# Patient Record
Sex: Female | Born: 1975
Health system: Southern US, Community
[De-identification: ages and names within clinical notes are randomized; demographics above are authoritative.]

## PROBLEM LIST (undated history)

## (undated) DIAGNOSIS — T7840XA Allergy, unspecified, initial encounter: Secondary | ICD-10-CM

## (undated) DIAGNOSIS — D649 Anemia, unspecified: Secondary | ICD-10-CM

## (undated) DIAGNOSIS — R87629 Unspecified abnormal cytological findings in specimens from vagina: Secondary | ICD-10-CM

## (undated) DIAGNOSIS — M1990 Unspecified osteoarthritis, unspecified site: Secondary | ICD-10-CM

## (undated) DIAGNOSIS — D259 Leiomyoma of uterus, unspecified: Secondary | ICD-10-CM

## (undated) DIAGNOSIS — F419 Anxiety disorder, unspecified: Secondary | ICD-10-CM

## (undated) DIAGNOSIS — E282 Polycystic ovarian syndrome: Secondary | ICD-10-CM

## (undated) DIAGNOSIS — F329 Major depressive disorder, single episode, unspecified: Secondary | ICD-10-CM

## (undated) DIAGNOSIS — Z90710 Acquired absence of both cervix and uterus: Secondary | ICD-10-CM

## (undated) DIAGNOSIS — E079 Disorder of thyroid, unspecified: Secondary | ICD-10-CM

## (undated) DIAGNOSIS — F32A Depression, unspecified: Secondary | ICD-10-CM

## (undated) HISTORY — DX: Leiomyoma of uterus, unspecified: D25.9

## (undated) HISTORY — DX: Unspecified abnormal cytological findings in specimens from vagina: R87.629

## (undated) HISTORY — DX: Acquired absence of both cervix and uterus: Z90.710

## (undated) HISTORY — DX: Disorder of thyroid, unspecified: E07.9

## (undated) HISTORY — DX: Anxiety disorder, unspecified: F41.9

## (undated) HISTORY — PX: TUBAL LIGATION: SHX77

## (undated) HISTORY — DX: Major depressive disorder, single episode, unspecified: F32.9

## (undated) HISTORY — DX: Depression, unspecified: F32.A

## (undated) HISTORY — DX: Polycystic ovarian syndrome: E28.2

## (undated) HISTORY — DX: Anemia, unspecified: D64.9

## (undated) HISTORY — PX: NASAL SINUS SURGERY: SHX719

## (undated) HISTORY — PX: TOTAL VAGINAL HYSTERECTOMY: SHX2548

## (undated) HISTORY — DX: Allergy, unspecified, initial encounter: T78.40XA

## (undated) HISTORY — DX: Unspecified osteoarthritis, unspecified site: M19.90

---

## 1998-12-10 HISTORY — PX: NASAL SINUS SURGERY: SHX719

## 2000-04-22 ENCOUNTER — Other Ambulatory Visit: Admission: RE | Admit: 2000-04-22 | Discharge: 2000-04-22 | Payer: Self-pay | Admitting: *Deleted

## 2002-02-12 ENCOUNTER — Ambulatory Visit (HOSPITAL_COMMUNITY): Admission: RE | Admit: 2002-02-12 | Discharge: 2002-02-12 | Payer: Self-pay | Admitting: Internal Medicine

## 2002-02-13 ENCOUNTER — Encounter: Payer: Self-pay | Admitting: Internal Medicine

## 2002-02-20 ENCOUNTER — Encounter: Payer: Self-pay | Admitting: Internal Medicine

## 2002-02-20 ENCOUNTER — Encounter: Admission: RE | Admit: 2002-02-20 | Discharge: 2002-02-20 | Payer: Self-pay | Admitting: Internal Medicine

## 2004-08-30 ENCOUNTER — Encounter: Admission: RE | Admit: 2004-08-30 | Discharge: 2004-08-30 | Payer: Self-pay | Admitting: Internal Medicine

## 2004-10-11 ENCOUNTER — Ambulatory Visit: Payer: Self-pay | Admitting: Internal Medicine

## 2004-12-06 ENCOUNTER — Ambulatory Visit: Payer: Self-pay | Admitting: Internal Medicine

## 2006-10-30 ENCOUNTER — Ambulatory Visit (HOSPITAL_COMMUNITY): Admission: RE | Admit: 2006-10-30 | Discharge: 2006-10-30 | Payer: Self-pay | Admitting: Orthopaedic Surgery

## 2007-12-11 DIAGNOSIS — R87629 Unspecified abnormal cytological findings in specimens from vagina: Secondary | ICD-10-CM

## 2007-12-11 HISTORY — DX: Unspecified abnormal cytological findings in specimens from vagina: R87.629

## 2008-12-10 HISTORY — PX: LAPAROSCOPIC CHOLECYSTECTOMY: SUR755

## 2009-03-03 ENCOUNTER — Ambulatory Visit: Payer: Self-pay | Admitting: General Surgery

## 2009-03-08 ENCOUNTER — Ambulatory Visit: Payer: Self-pay | Admitting: General Surgery

## 2009-12-13 ENCOUNTER — Encounter (INDEPENDENT_AMBULATORY_CARE_PROVIDER_SITE_OTHER): Payer: Self-pay | Admitting: *Deleted

## 2010-07-24 ENCOUNTER — Telehealth: Payer: Self-pay | Admitting: Internal Medicine

## 2011-01-09 NOTE — Progress Notes (Signed)
Summary: Schedule Colonoscopy  Phone Note Outgoing Call Call back at [facility] Phone [phone]   Call placed by: Harlow Mares CMA Duncan Dull),  July 24, 2010 10:19 AM Call placed to: Patient Summary of Call: Left message on patients machine to call back. patient is due for a colonoscopy due to personal hx of colon polyps.  Initial call taken by: Harlow Mares CMA Duncan Dull),  July 24, 2010 10:20 AM  Follow-up for Phone Call        Left a message on the patient machine to call back and schedule a previsit and procedure with our office. A letter will be mailed to the patient.   Follow-up by: Harlow Mares CMA Duncan Dull),  July 31, 2010 9:10 AM

## 2011-01-09 NOTE — Letter (Signed)
Summary: Colonoscopy Letter  [facility]  [address] [place], [place] [address]   Phone: [phone]  Fax: [phone]      December 13, 2009 MRN: [id number]   Marissa Sandoval [address] [place], [place]  [address]   Dear Ms. Racine,   According to your medical record, it is time for you to schedule a Colonoscopy. The American Cancer Society recommends this procedure as a method to detect early colon cancer. Patients with a family history of colon cancer, or a personal history of colon polyps or inflammatory bowel disease are at increased risk.  This letter has beeen generated based on the recommendations made at the time of your procedure. If you feel that in your particular situation this may no longer apply, please contact our office.  Please call our office at [phone] to schedule this appointment or to update your records at your earliest convenience.  Thank you for cooperating with [place] to provide you with the very best care possible.   Sincerely,  Hedwig Morton. Juanda Chance, M.D.  [facility] [phone]

## 2011-12-03 ENCOUNTER — Ambulatory Visit: Payer: Self-pay

## 2012-03-03 ENCOUNTER — Encounter: Payer: Self-pay | Admitting: Internal Medicine

## 2012-03-18 ENCOUNTER — Encounter: Payer: Self-pay | Admitting: Internal Medicine

## 2012-04-29 ENCOUNTER — Encounter: Payer: Self-pay | Admitting: Internal Medicine

## 2012-05-14 IMAGING — US US PELV - US TRANSVAGINAL
1 series · 17 of 25 positions shown · non-contrast
Comparison: none

REASON FOR EXAM: heavy menstrual cycles PCOS RL abd pain
COMMENTS:

[Series 1: us pelv - us transvaginal · 17 of 106 slices shown]
[im 1/106]
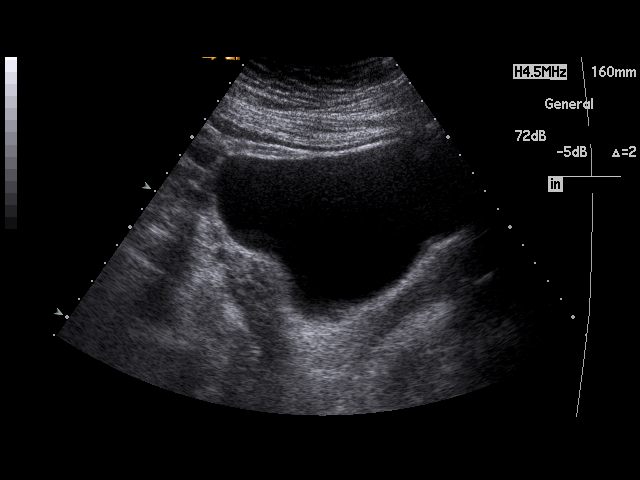
[im 9/106]
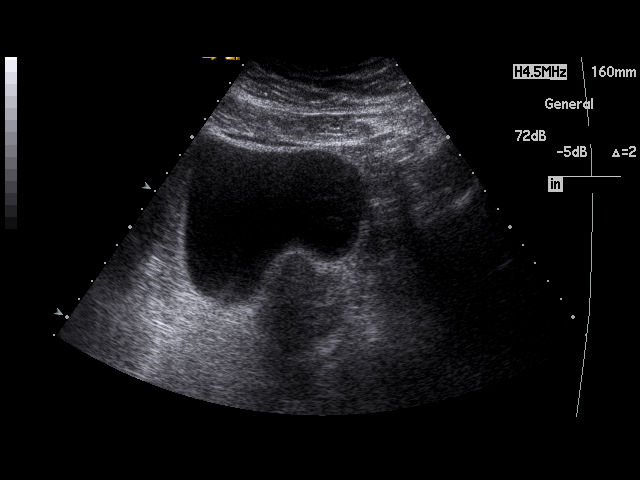
[im 14/106]
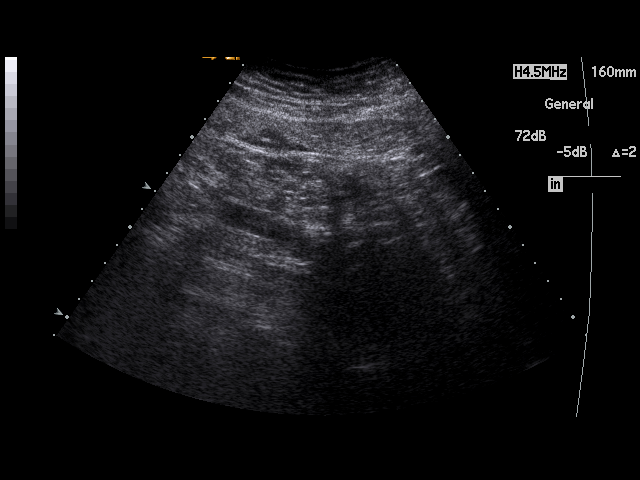
[im 22/106]
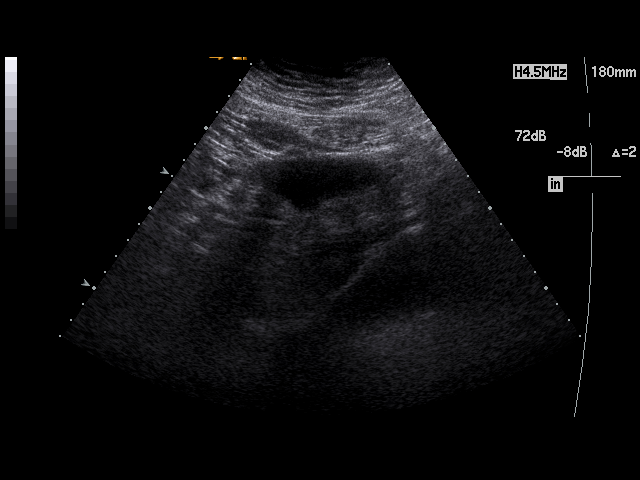
[im 27/106]
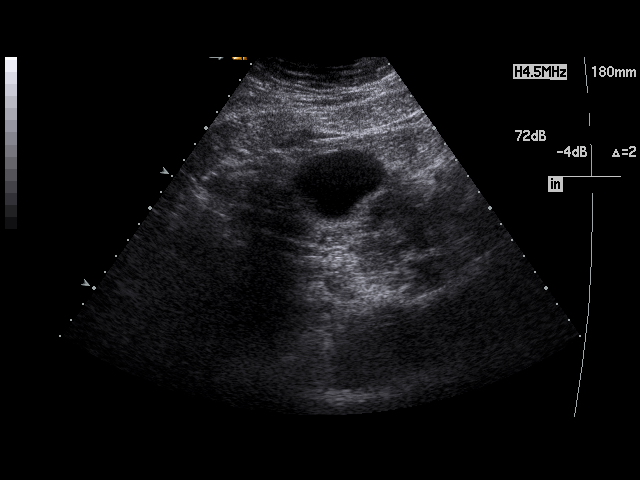
[im 36/106]
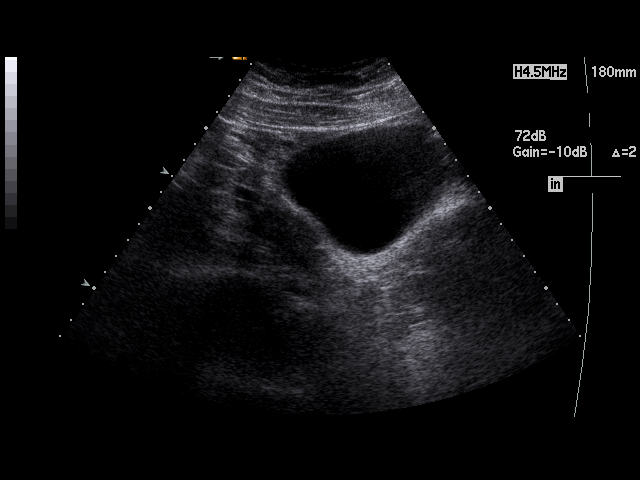
[im 40/106]
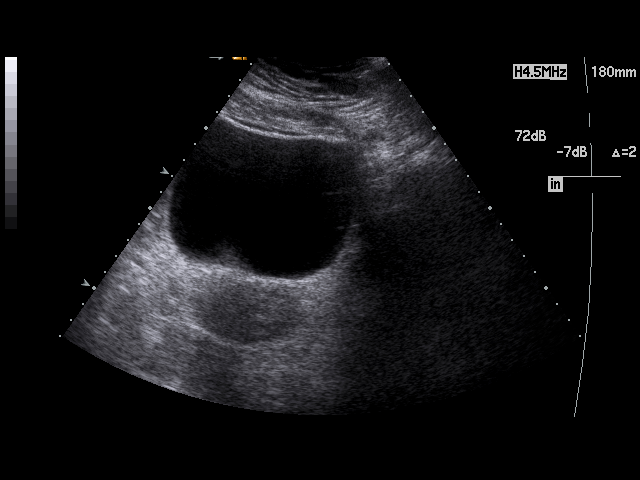
[im 49/106]
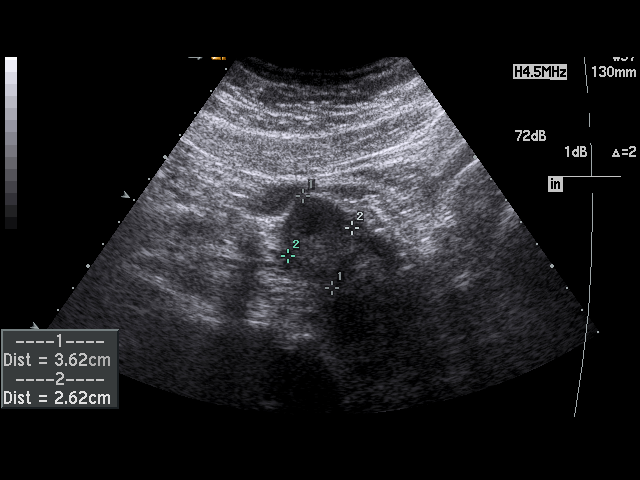
[im 53/106]
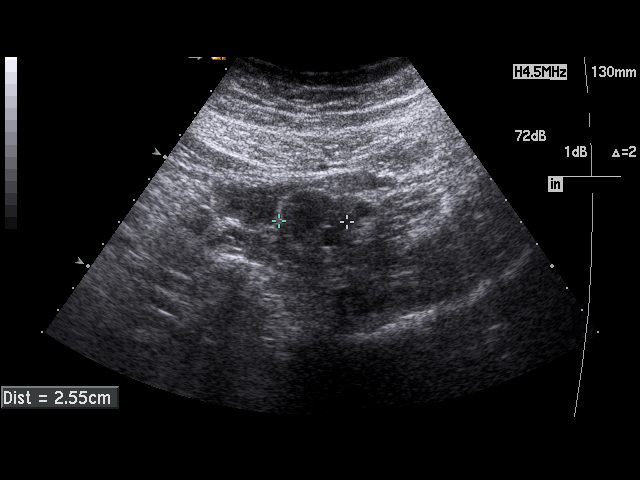
[im 57/106]
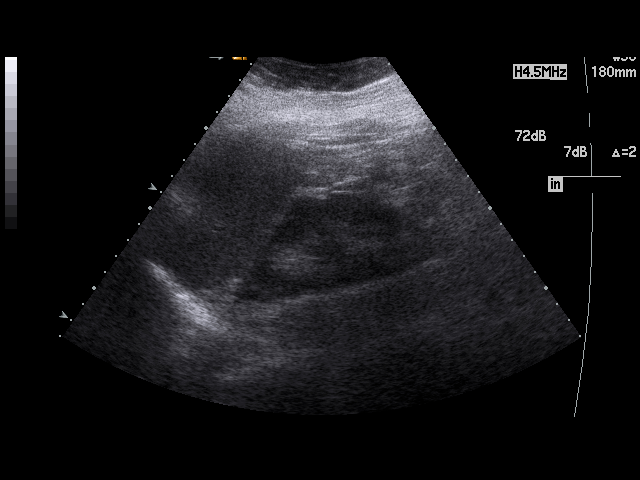
[im 66/106]
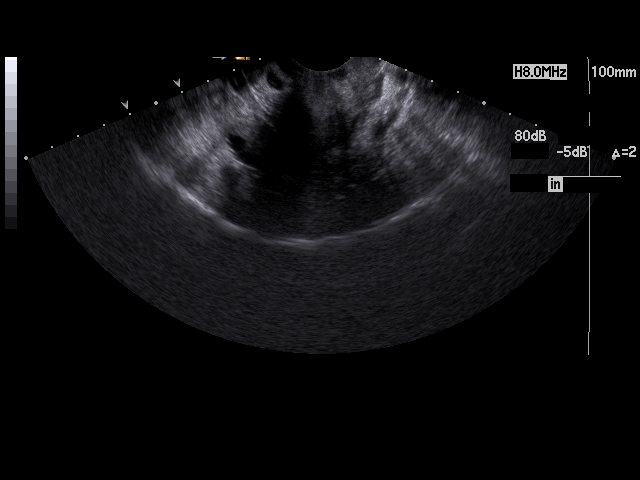
[im 71/106]
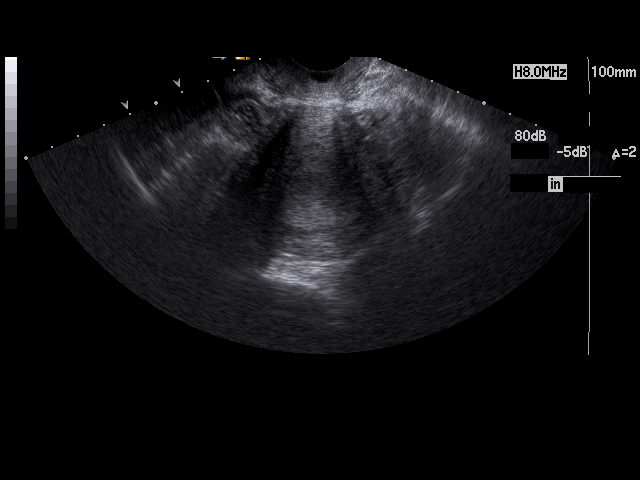
[im 79/106]
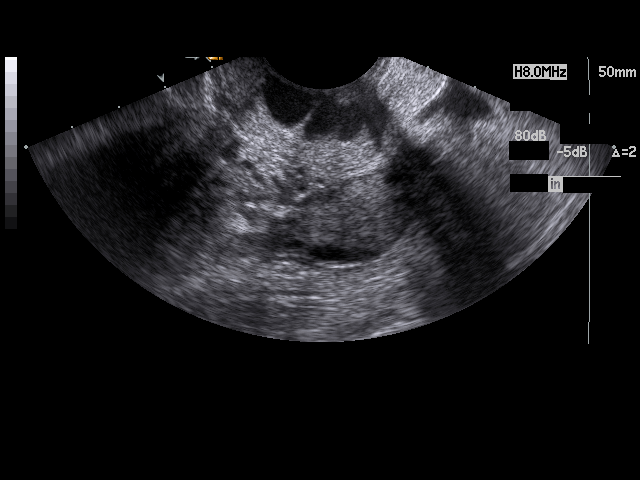
[im 84/106]
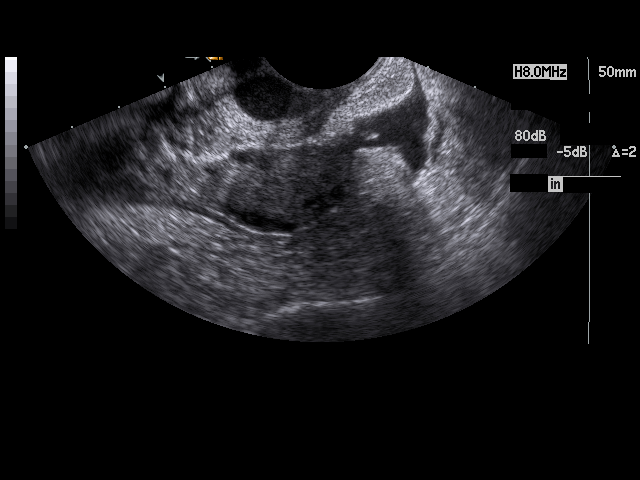
[im 92/106]
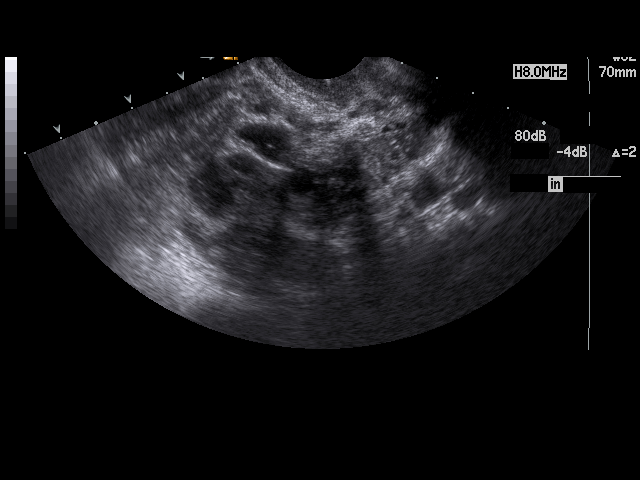
[im 97/106]
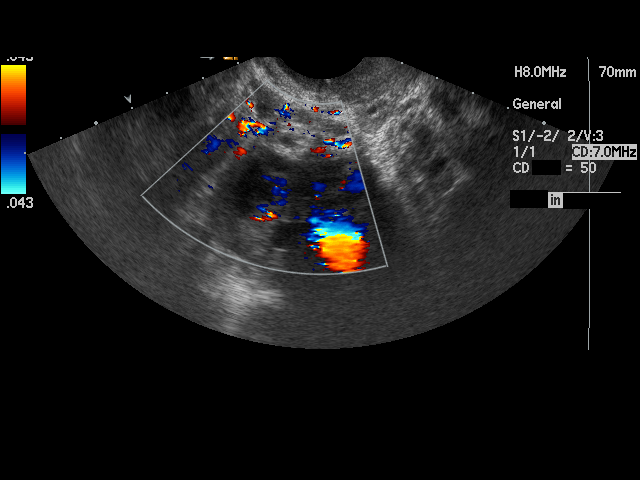
[im 106/106]
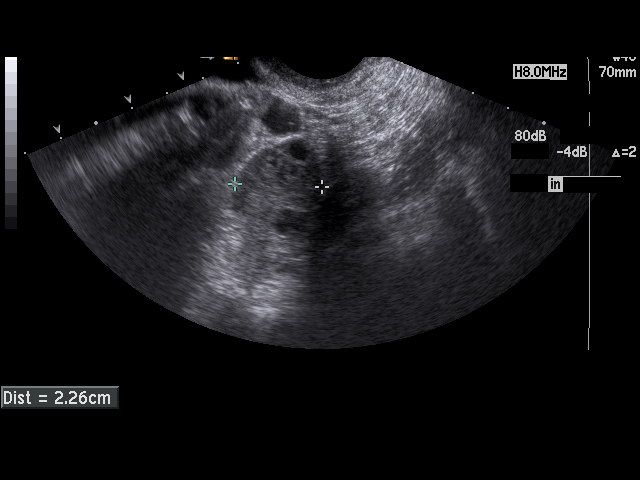

[17 of 25 positions shown; findings below may reference images not displayed]

PROCEDURE:     US  - US PELVIS EXAM W/TRANSVAGINAL  - December 03, 2011  [DATE]

RESULT:

The uterus demonstrates a homogeneous echotexture measuring 8.69 x 5.33 x
4.55 cm. Endometrium is unremarkable. The right ovary measures 3.68 x 1.91 x
1.56 cm. The left ovary measures 3.51 x 1.74 x 2.76 cm. Color flow is
identified within the right and left ovaries. A cyst is identified within
the left ovary. A small amount of fluid is identified adjacent to the left
ovary. This may represent the sequela of a recent cyst rupture. The ovaries
otherwise are unremarkable demonstrating color flow. No further pelvic free
fluid or loculated fluid collections are identified.
IMPRESSION: Small amount of periovarian fluid on the right. Cyst is
identified within the left ovary and these findings may reflect the sequela
of a prior ruptured ovarian cyst. No further sonographic abnormalities are
identified.

## 2012-07-11 DIAGNOSIS — M25569 Pain in unspecified knee: Secondary | ICD-10-CM | POA: Insufficient documentation

## 2012-07-11 DIAGNOSIS — M5416 Radiculopathy, lumbar region: Secondary | ICD-10-CM | POA: Insufficient documentation

## 2012-07-11 DIAGNOSIS — M79639 Pain in unspecified forearm: Secondary | ICD-10-CM | POA: Insufficient documentation

## 2013-08-14 DIAGNOSIS — M79673 Pain in unspecified foot: Secondary | ICD-10-CM | POA: Insufficient documentation

## 2013-08-14 DIAGNOSIS — M722 Plantar fascial fibromatosis: Secondary | ICD-10-CM | POA: Insufficient documentation

## 2015-05-18 ENCOUNTER — Ambulatory Visit (AMBULATORY_SURGERY_CENTER): Payer: Self-pay | Admitting: *Deleted

## 2015-05-18 ENCOUNTER — Encounter: Payer: Self-pay | Admitting: Internal Medicine

## 2015-05-18 VITALS — Ht 65.0 in | Wt 201.6 lb

## 2015-05-18 DIAGNOSIS — Z86010 Personal history of colonic polyps: Secondary | ICD-10-CM

## 2015-05-18 MED ORDER — NA SULFATE-K SULFATE-MG SULF 17.5-3.13-1.6 GM/177ML PO SOLN
ORAL | Status: DC
Start: 2015-05-18 — End: 2015-06-17

## 2015-05-18 NOTE — Progress Notes (Signed)
No allergies to eggs or soy. No problems with anesthesia.  Pt given Emmi instructions for colonoscopy  No oxygen use  No diet drug use  

## 2015-05-19 ENCOUNTER — Encounter: Payer: Self-pay | Admitting: Internal Medicine

## 2015-06-01 ENCOUNTER — Encounter: Payer: Self-pay | Admitting: Internal Medicine

## 2015-06-17 ENCOUNTER — Ambulatory Visit (AMBULATORY_SURGERY_CENTER): Payer: BLUE CROSS/BLUE SHIELD | Admitting: Internal Medicine

## 2015-06-17 ENCOUNTER — Encounter: Payer: Self-pay | Admitting: Internal Medicine

## 2015-06-17 VITALS — BP 128/90 | HR 61 | Temp 97.2°F | Resp 60 | Ht 65.0 in | Wt 201.0 lb

## 2015-06-17 DIAGNOSIS — Z86010 Personal history of colonic polyps: Secondary | ICD-10-CM | POA: Diagnosis not present

## 2015-06-17 MED ORDER — SODIUM CHLORIDE 0.9 % IV SOLN: 500.0000 mL | INTRAVENOUS | Status: DC

## 2015-06-17 NOTE — Progress Notes (Signed)
To recovery, report to Scott, RN, VSS 

## 2015-06-17 NOTE — Patient Instructions (Addendum)
YOU HAD AN ENDOSCOPIC PROCEDURE TODAY AT THE [facility]:   Refer to the procedure report that was given to you for any specific questions about what was found during the examination.  If the procedure report does not answer your questions, please call your gastroenterologist to clarify.  If you requested that your care partner not be given the details of your procedure findings, then the procedure report has been included in a sealed envelope for you to review at your convenience later.  YOU SHOULD EXPECT: Some feelings of bloating in the abdomen. Passage of more gas than usual.  Walking can help get rid of the air that was put into your GI tract during the procedure and reduce the bloating. If you had a lower endoscopy (such as a colonoscopy or flexible sigmoidoscopy) you may notice spotting of blood in your stool or on the toilet paper. If you underwent a bowel prep for your procedure, you may not have a normal bowel movement for a few days.  Please Note:  You might notice some irritation and congestion in your nose or some drainage.  This is from the oxygen used during your procedure.  There is no need for concern and it should clear up in a day or so.  SYMPTOMS TO REPORT IMMEDIATELY:    Following upper endoscopy (EGD)  Vomiting of blood or coffee ground material  New chest pain or pain under the shoulder blades  Painful or persistently difficult swallowing  New shortness of breath  Fever of 100F or higher  Black, tarry-looking stools  For urgent or emergent issues, a gastroenterologist can be reached at any hour by calling [phone].   DIET: Your first meal following the procedure should be a small meal and then it is ok to progress to your normal diet. Heavy or fried foods are harder to digest and may make you feel nauseous or bloated.  Likewise, meals heavy in dairy and vegetables can increase bloating.  Drink plenty of fluids but you should avoid alcoholic beverages  for 24 hours.  ACTIVITY:  You should plan to take it easy for the rest of today and you should NOT DRIVE or use heavy machinery until tomorrow (because of the sedation medicines used during the test).    FOLLOW UP: Our staff will call the number listed on your records the next business day following your procedure to check on you and address any questions or concerns that you may have regarding the information given to you following your procedure. If we do not reach you, we will leave a message.  However, if you are feeling well and you are not experiencing any problems, there is no need to return our call.  We will assume that you have returned to your regular daily activities without incident.  If any biopsies were taken you will be contacted by phone or by letter within the next 1-3 weeks.  Please call us at [phone] if you have not heard about the biopsies in 3 weeks.    SIGNATURES/CONFIDENTIALITY: You and/or your care partner have signed paperwork which will be entered into your electronic medical record.  These signatures attest to the fact that that the information above on your After Visit Summary has been reviewed and is understood.  Full responsibility of the confidentiality of this discharge information lies with you and/or your care-partner. 

## 2015-06-17 NOTE — Op Note (Signed)
[facility]  [address] [place], [address]   COLONOSCOPY PROCEDURE REPORT  PATIENT: Marissa Sandoval, Marissa Sandoval  MR#: [id number] BIRTHDATE: Oct 08, 1976 , 39  yrs. old GENDER: female ENDOSCOPIST: Lafayette Dragon, MD REFERRED HY:HOOILNZ Boscia NP PROCEDURE DATE:  06/17/2015 PROCEDURE:   Colonoscopy, surveillance First Screening Colonoscopy - Avg.  risk and is 50 yrs.  old or older - No.  Prior Negative Screening - Now for repeat screening. N/A  History of Adenoma - Now for follow-up colonoscopy & has been > or = to 3 yrs.  N/A  Polyps removed today? No Recommend repeat exam, <10 yrs? No ASA CLASS:   Class II INDICATIONS:Screening for colonic neoplasia, Colorectal Neoplasm Risk Assessment for this procedure is average risk, and [facility] colonoscopy in December 2005 polyp at 15 cm was removed.  Pathology not available. MEDICATIONS: Monitored anesthesia care and Propofol 200 mg IV  DESCRIPTION OF PROCEDURE:   After the risks benefits and alternatives of the procedure were thoroughly explained, informed consent was obtained.  The digital rectal exam revealed no abnormalities of the rectum.   The LB [id number]  endoscope was introduced through the anus and advanced to the cecum, which was identified by both the appendix and ileocecal valve. No adverse events experienced.   The quality of the prep was good.  (MoviPrep was used)  The instrument was then slowly withdrawn as the colon was fully examined. Estimated blood loss is zero unless otherwise noted in this procedure report.      COLON FINDINGS: A normal appearing cecum, ileocecal valve, and appendiceal orifice were identified.  The ascending, transverse, descending, sigmoid colon, and rectum appeared unremarkable. Retroflexed views revealed no abnormalities. The time to cecum = 4.39 Withdrawal time = 6.05   The scope was withdrawn and the procedure completed. COMPLICATIONS: There were no immediate  complications.  ENDOSCOPIC IMPRESSION: Normal colonoscopy  RECOMMENDATIONS: High-fiber diet Recall colonoscopy in 10 years at age 32  eSigned:  Lafayette Dragon, MD 06/17/2015 10:58 AM   cc:

## 2015-06-20 ENCOUNTER — Telehealth: Payer: Self-pay | Admitting: *Deleted

## 2015-06-20 NOTE — Telephone Encounter (Signed)
No answer, message left for the patient. 

## 2015-07-01 ENCOUNTER — Ambulatory Visit: Payer: Self-pay | Admitting: Obstetrics and Gynecology

## 2015-07-15 ENCOUNTER — Ambulatory Visit (INDEPENDENT_AMBULATORY_CARE_PROVIDER_SITE_OTHER): Payer: BLUE CROSS/BLUE SHIELD | Admitting: Obstetrics and Gynecology

## 2015-07-15 ENCOUNTER — Encounter: Payer: Self-pay | Admitting: Obstetrics and Gynecology

## 2015-07-15 VITALS — BP 120/74 | HR 76 | Ht 65.0 in | Wt 202.0 lb

## 2015-07-15 DIAGNOSIS — N920 Excessive and frequent menstruation with regular cycle: Secondary | ICD-10-CM | POA: Insufficient documentation

## 2015-07-15 DIAGNOSIS — Z01419 Encounter for gynecological examination (general) (routine) without abnormal findings: Secondary | ICD-10-CM

## 2015-07-15 DIAGNOSIS — N921 Excessive and frequent menstruation with irregular cycle: Secondary | ICD-10-CM

## 2015-07-15 NOTE — Progress Notes (Signed)
Subjective:    Marissa Sandoval is a 39 y.o. P37 female who presents for an annual exam. The patient has no complaints today. The patient is sexually active. GYN screening history: last pap: approximate date 04/2014 and was normal and last mammogram: patient has never had a mammogram. The patient wears seatbelts: yes. The patient participates in regular exercise: yes. Has the patient ever been transfused or tattooed?: no. The patient reports that there is not domestic violence in her life.   Menstrual History: OB History    Gravida Para Term Preterm AB TAB SAB Ectopic Multiple Living   3  3 0 0 0 0 0 0 3      Menarche age: 24 Patient's last menstrual period was 06/17/2015.    Past Medical History  Diagnosis Date  . Allergy   . Anemia   . Anxiety   . Arthritis   . Depression     Past Surgical History  Procedure Laterality Date  . Laparoscopic cholecystectomy  2010  . Nasal sinus surgery  2000    Family History  Problem Relation Age of Onset  . Colon cancer Neg Hx   . Esophageal cancer Neg Hx   . Rectal cancer Neg Hx   . Stomach cancer Neg Hx     History   Social History  . Marital Status: Single    Spouse Name: N/A  . Number of Children: N/A  . Years of Education: N/A   Occupational History  . Not on file.   Social History Main Topics  . Smoking status: Never Smoker   . Smokeless tobacco: Never Used  . Alcohol Use: No  . Drug Use: No  . Sexual Activity: Not on file   Other Topics Concern  . Not on file   Social History Narrative    Medication Sig  . B Complex Vitamins (B COMPLEX PO) Take 1 capsule by mouth daily.  . celecoxib (CELEBREX) 200 MG capsule daily.  . Cetirizine HCl 10 MG CAPS Take by mouth.  . Cholecalciferol (VITAMIN D3) 5000 UNITS CAPS Take by mouth daily.  . ferrous sulfate 325 (65 FE) MG tablet Take 325 mg by mouth daily.  . flunisolide (NASALIDE) 25 MCG/ACT (0.025%) SOLN Place 2 sprays into the nose daily.  Marland Kitchen GABAPENTIN PO Take 100 mg  by mouth at bedtime.  Marland Kitchen GLUCOSAMINE HCL PO Take by mouth daily.  . Multiple Vitamins-Minerals (MULTIVITAMIN ADULT PO) Take 1 capsule by mouth daily.  . sertraline (ZOLOFT) 25 MG tablet Take 25 mg by mouth daily.  Marland Kitchen spironolactone (ALDACTONE) 100 MG tablet daily.  Marland Kitchen ZARAH 3-0.03 MG tablet daily.    Allergies  Allergen Reactions  . Latex Itching and Rash  . Penicillins Nausea And Vomiting    Review of Systems  Constitutional: positive for weight loss (intentional, 25 lbs); negative for chills, fatigue, fevers and sweats Eyes: negative for irritation, redness and visual disturbance Ears, nose, mouth, throat, and face: negative for hearing loss, nasal congestion, snoring and tinnitus Respiratory: negative for asthma, cough, sputum Cardiovascular: negative for chest pain, dyspnea, exertional chest pressure/discomfort, irregular heart beat, palpitations and syncope Gastrointestinal: negative for abdominal pain, change in bowel habits, nausea and vomiting Genitourinary: negative for abnormal menstrual periods, genital lesions, sexual problems and vaginal discharge, dysuria and urinary incontinence Integument/breast: negative for breast lump, breast tenderness and nipple discharge Hematologic/lymphatic: negative for bleeding and easy bruising Musculoskeletal:negative for back pain and muscle weakness Neurological: negative for dizziness, headaches, vertigo and weakness Endocrine: negative for  diabetic symptoms including polydipsia, polyuria and skin dryness Allergic/Immunologic: negative for hay fever and urticaria    Objective:   Blood pressure 120/74, pulse 76, height 5\' 5"  (1.651 m), weight 202 lb (91.627 kg), last menstrual period 05/18/2015.  General Appearance:    Alert, cooperative, no distress, appears stated age  Head:    Normocephalic, without obvious abnormality, atraumatic  Eyes:    PERRL, conjunctiva/corneas clear, EOM's intact, both eyes  Ears:    Normal external ear canals,  both ears  Nose:   Nares normal, septum midline, mucosa normal, no drainage or sinus tenderness  Throat:   Lips, mucosa, and tongue normal; teeth and gums normal  Neck:   Supple, symmetrical, trachea midline, no adenopathy; thyroid: no enlargement/tenderness/nodules; no       carotid bruit or JVD  Back:     Symmetric, no curvature, ROM normal, no CVA tenderness  Lungs:     Clear to auscultation bilaterally, respirations unlabored  Chest Wall:    No tenderness or deformity   Heart:    Regular rate and rhythm, S1 and S2 normal, no murmur, rub or gallop  Breast Exam:    No tenderness, masses, or nipple abnormality  Abdomen:     Soft, non-tender, bowel sounds active all four quadrants, no masses, no organomegaly.    Genitalia:    Pelvic:external genitalia normal, vagina with lesions, discharge, or tenderness, rectovaginal septum       normal. Cervix normal in appearance, no cervical motion tenderness, no adnexal masses or tenderness.  Rectal:    Normal external sphincter.  No hemorrhoids appreciated. Internal exam not done.   Extremities:   Extremities normal, atraumatic, no cyanosis or edema  Pulses:   2+ and symmetric all extremities  Skin:   Skin color, texture, turgor normal, no rashes or lesions, scant hair growth under chin.   Lymph nodes:   Cervical, supraclavicular, and axillary nodes normal  Neurologic:   CNII-XII intact, normal strength, sensation and reflexes hroughout    Assessment:    Healthy female exam.    Plan:    Blood tests: CBC with diff, Comprehensive metabolic panel, TSH and Lipid panel, HgbA1c. Breast self exam technique reviewed and patient encouraged to perform self-exam monthly. Contraception: oral progesterone-only contraceptive. Discussed healthy lifestyle modifications. Mammogram.  To be ordered by PCP.  Declines STD screening.  RTC in 1 year for annual exam.  To f/u q4 months for potassium levels while on spironolactone for treatment of hirsutism due to PCOS.    Rubie Maid, MD [facility]

## 2015-07-16 LAB — LIPID PANEL
CHOL/HDL RATIO: 2 ratio (ref 0.0–4.4)
Cholesterol, Total: 163 mg/dL (ref 100–199)
HDL: 80 mg/dL (ref >39)
LDL Calculated: 58 mg/dL (ref 0–99)
TRIGLYCERIDES: 125 mg/dL (ref 0–149)
VLDL Cholesterol Cal: 25 mg/dL (ref 5–40)

## 2015-07-16 LAB — COMPREHENSIVE METABOLIC PANEL
A/G RATIO: 1.6 (ref 1.1–2.5)
ALBUMIN: 4.2 g/dL (ref 3.5–5.5)
ALT: 15 IU/L (ref 0–32)
AST: 19 IU/L (ref 0–40)
Alkaline Phosphatase: 51 IU/L (ref 39–117)
BUN/Creatinine Ratio: 16 (ref 9–23)
BUN: 14 mg/dL (ref 6–24)
Bilirubin Total: 0.4 mg/dL (ref 0.0–1.2)
CO2: 25 mmol/L (ref 18–29)
Calcium: 9 mg/dL (ref 8.7–10.2)
Chloride: 102 mmol/L (ref 97–108)
Creatinine, Ser: 0.9 mg/dL (ref 0.57–1.00)
GFR, EST AFRICAN AMERICAN: 92 mL/min/{1.73_m2} (ref >59)
GFR, EST NON AFRICAN AMERICAN: 80 mL/min/{1.73_m2} (ref >59)
GLOBULIN, TOTAL: 2.7 g/dL (ref 1.5–4.5)
Glucose: 96 mg/dL (ref 65–99)
Potassium: 4.3 mmol/L (ref 3.5–5.2)
Sodium: 140 mmol/L (ref 134–144)
Total Protein: 6.9 g/dL (ref 6.0–8.5)

## 2015-07-16 LAB — HEMOGLOBIN A1C
Est. average glucose Bld gHb Est-mCnc: 111 mg/dL
HEMOGLOBIN A1C: 5.5 % (ref 4.8–5.6)

## 2015-07-16 LAB — CBC
HEMOGLOBIN: 13.4 g/dL (ref 11.1–15.9)
Hematocrit: 39.9 % (ref 34.0–46.6)
MCH: 30.2 pg (ref 26.6–33.0)
MCHC: 33.6 g/dL (ref 31.5–35.7)
MCV: 90 fL (ref 79–97)
Platelets: 256 10*3/uL (ref 150–379)
RBC: 4.44 x10E6/uL (ref 3.77–5.28)
RDW: 13 % (ref 12.3–15.4)
WBC: 5 10*3/uL (ref 3.4–10.8)

## 2015-07-16 LAB — TSH: TSH: 0.388 u[IU]/mL — AB (ref 0.450–4.500)

## 2016-04-17 ENCOUNTER — Other Ambulatory Visit: Payer: Self-pay | Admitting: Obstetrics and Gynecology

## 2016-04-20 ENCOUNTER — Other Ambulatory Visit: Payer: Self-pay | Admitting: Obstetrics and Gynecology

## 2016-05-24 ENCOUNTER — Other Ambulatory Visit: Payer: Self-pay | Admitting: Obstetrics and Gynecology

## 2016-05-25 ENCOUNTER — Telehealth: Payer: Self-pay | Admitting: Obstetrics and Gynecology

## 2016-05-25 NOTE — Telephone Encounter (Signed)
PHARMACY TOLD HER HER FLUID PILL NEEDED AUTHORIZATION SHE CALLED TO FIND OUT WHY

## 2016-05-25 NOTE — Telephone Encounter (Signed)
Please let this pt know that she needs a follow up appt, the last time she saw Dr.Cherry was last year. We will not give any more refills until she has an appt. Please have her scheduled in the first available slot.

## 2016-05-28 NOTE — Telephone Encounter (Signed)
No, Dr.Cherry's last note states that the pt is to come every 4 months to have her meds refilled and potassium levels checked. We will not refill until she has been seen. Thanks

## 2016-05-28 NOTE — Telephone Encounter (Signed)
This pt was under the assumption that when she came for AE last aug/2016 that the refills were good for a year. Her BC and Fluid med was refilled at her last AE and her [facility] is still refillable. She has an Ae scheduled for 8/12 and said that she only comes once a yr for her med refills and AE. Can she get enough of her fluid med until 8/12

## 2016-07-19 ENCOUNTER — Encounter: Payer: Self-pay | Admitting: Obstetrics and Gynecology

## 2016-07-19 ENCOUNTER — Ambulatory Visit (INDEPENDENT_AMBULATORY_CARE_PROVIDER_SITE_OTHER): Payer: BLUE CROSS/BLUE SHIELD | Admitting: Obstetrics and Gynecology

## 2016-07-19 VITALS — BP 114/78 | HR 83 | Ht 65.0 in | Wt 221.6 lb

## 2016-07-19 DIAGNOSIS — Z1239 Encounter for other screening for malignant neoplasm of breast: Secondary | ICD-10-CM | POA: Diagnosis not present

## 2016-07-19 DIAGNOSIS — E282 Polycystic ovarian syndrome: Secondary | ICD-10-CM

## 2016-07-19 DIAGNOSIS — Z01419 Encounter for gynecological examination (general) (routine) without abnormal findings: Secondary | ICD-10-CM | POA: Diagnosis not present

## 2016-07-19 NOTE — Progress Notes (Signed)
GYNECOLOGY ANNUAL PHYSICAL EXAM PROGRESS NOTE  Subjective:    Marissa Sandoval is a 40 y.o. [id number] female with h/o PCOS who presents for an annual exam. The patient has no complaints today. The patient is sexually active. The patient wears seatbelts: yes. The patient participates in regular exercise: no. Has the patient ever been transfused or tattooed?: no. The patient reports that there is not domestic violence in her life.    Gynecologic History  Menarche age: 73 No LMP recorded. Patient is not currently having periods (Reason: OCPs). Contraception: OCP (estrogen/progesterone) History of STI's: Denies Last Pap: 2015. Results were: normal.  Denies h/o abnormal pap smears. Last mammogram: 2016 (performed in [place]). Results were: normal   Obstetric History   G3   P3   T3   P0   A0   L3    SAB0   TAB0   Ectopic0   Multiple0   Live Births3     # Outcome Date GA Lbr Len/2nd Weight Sex Delivery Anes PTL Lv  3 Term      Vag-Spont  N LIV  2 Term      Vag-Spont  N LIV  1 Term      Vag-Spont  N LIV      Past Medical History:  Diagnosis Date  . Allergy   . Anemia   . Anxiety   . Arthritis   . Depression   . Fibroid uterus   . PCOS (polycystic ovarian syndrome)   . Thyroid disease   . Vaginal Pap smear, abnormal 2009    Past Surgical History:  Procedure Laterality Date  . LAPAROSCOPIC CHOLECYSTECTOMY  2010  . NASAL SINUS SURGERY  2000    Family History  Problem Relation Age of Onset  . Diabetes Mother   . Hypertension Mother   . Thyroid disease Mother   . Stomach cancer Father   . Colon cancer Neg Hx   . Esophageal cancer Neg Hx   . Rectal cancer Neg Hx     Social History   Social History  . Marital status: Single    Spouse name: N/A  . Number of children: N/A  . Years of education: N/A   Occupational History  . Not on file.   Social History Main Topics  . Smoking status: Never Smoker  . Smokeless tobacco: Never Used  . Alcohol use No  .  Drug use: No  . Sexual activity: Yes    Birth control/ protection: Pill   Other Topics Concern  . Not on file   Social History Narrative  . No narrative on file    Current Outpatient Prescriptions on File Prior to Visit  Medication Sig Dispense Refill  . B Complex Vitamins (B COMPLEX PO) Take 1 capsule by mouth daily.    . Cetirizine HCl 10 MG CAPS Take by mouth.    Marland Kitchen GABAPENTIN PO Take 100 mg by mouth at bedtime.    Marland Kitchen GLUCOSAMINE HCL PO Take by mouth daily.    . Multiple Vitamins-Minerals (MULTIVITAMIN ADULT PO) Take 1 capsule by mouth daily.    . sertraline (ZOLOFT) 25 MG tablet Take 25 mg by mouth daily.    Marland Kitchen spironolactone (ALDACTONE) 100 MG tablet TAKE 1 TABLET BY MOUTH DAILY 30 tablet 0  . ZARAH 3-0.03 MG tablet TAKE 1 TABLET BY MOUTH DAILY 84 tablet 1   No current facility-administered medications on file prior to visit.     Allergies  Allergen Reactions  .  Latex Itching and Rash  . Penicillins Nausea And Vomiting    Review of Systems Constitutional: negative for chills, fatigue, fevers and sweats Eyes: negative for irritation, redness and visual disturbance Ears, nose, mouth, throat, and face: negative for hearing loss, nasal congestion, snoring and tinnitus Respiratory: negative for asthma, cough, sputum Cardiovascular: negative for chest pain, dyspnea, exertional chest pressure/discomfort, irregular heart beat, palpitations and syncope Gastrointestinal: negative for abdominal pain, change in bowel habits, nausea and vomiting Genitourinary: negative for abnormal menstrual periods, genital lesions, sexual problems and vaginal discharge, dysuria and urinary incontinence Integument/breast: negative for breast lump, breast tenderness and nipple discharge.  Slight increase in facial hair (is plucking facial hair weekly).  Hematologic/lymphatic: negative for bleeding and easy bruising Musculoskeletal:negative for back pain and muscle weakness Neurological: negative for  dizziness, headaches, vertigo and weakness Endocrine: negative for diabetic symptoms including polydipsia, polyuria and skin dryness Allergic/Immunologic: negative for hay fever and urticaria      Objective:  Blood pressure 114/78, pulse 83, height 5\' 5"  (1.651 m), weight 221 lb 9.6 oz (100.5 kg). Body mass index is 36.88 kg/m.  General Appearance:    Alert, cooperative, no distress, appears stated age, obese  Head:    Normocephalic, without obvious abnormality, atraumatic  Eyes:    PERRL, conjunctiva/corneas clear, EOM's intact, both eyes  Ears:    Normal external ear canals, both ears  Nose:   Nares normal, septum midline, mucosa normal, no drainage or sinus tenderness  Throat:   Lips, mucosa, and tongue normal; teeth and gums normal  Neck:   Supple, symmetrical, trachea midline, no adenopathy; thyroid: no enlargement/tenderness/nodules; no carotid bruit or JVD  Back:     Symmetric, no curvature, ROM normal, no CVA tenderness  Lungs:     Clear to auscultation bilaterally, respirations unlabored  Chest Wall:    No tenderness or deformity   Heart:    Regular rate and rhythm, S1 and S2 normal, no murmur, rub or gallop  Breast Exam:    No tenderness, masses, or nipple abnormality  Abdomen:     Soft, non-tender, bowel sounds active all four quadrants, no masses, no organomegaly.    Genitalia:    Pelvic:external genitalia normal, vagina without lesions, discharge, or tenderness, rectovaginal septum  normal. Cervix normal in appearance, no cervical motion tenderness, no adnexal masses or tenderness.  Uterus normal size, shape, mobile, regular contours, nontender.  Rectal:    Normal external sphincter.  No hemorrhoids appreciated. Internal exam not done.   Extremities:   Extremities normal, atraumatic, no cyanosis or edema  Pulses:   2+ and symmetric all extremities  Skin:   Skin color, texture, turgor normal, no rashes or lesions.  Small amount of facial hair along chin and right jaw region    Lymph nodes:   Cervical, supraclavicular, and axillary nodes normal  Neurologic:   CNII-XII intact, normal strength, sensation and reflexes throughout   . Labs:  Lab Results  Component Value Date   WBC 5.0 07/15/2015   HCT 39.9 07/15/2015   MCV 90 07/15/2015   PLT 256 07/15/2015    Lab Results  Component Value Date   CREATININE 0.90 07/15/2015   BUN 14 07/15/2015   NA 140 07/15/2015   K 4.3 07/15/2015   CL 102 07/15/2015   CO2 25 07/15/2015    Lab Results  Component Value Date   ALT 15 07/15/2015   AST 19 07/15/2015   ALKPHOS 51 07/15/2015   BILITOT 0.4 07/15/2015    Lab  Results  Component Value Date   TSH 0.388 (L) 07/15/2015     Assessment:   Annual gynecologic exam.  PCOS  Plan:    Patient notes having recent bloodwork completed at the [place].  Advised patient to fax results to [facility].  Desires to have all of her prescriptions refilled, will need to go be sent to new pharmacy at the [place].  Breast self exam technique reviewed and patient encouraged to perform self-exam monthly. Discussed healthy lifestyle modifications. Mammogram ordered.  Pap smear up to date.  Will need repeat in 2018.   Discussion had with management of PCOS.  Currently not menstruating on OCPs.  Is taking Spironolactone for management of hair growth.  Was working well in the past, however notes that she is having to increase plucking frequency.  Discussed trial of Vaniqua.  Patient previously declined in the past as it was not as bothersome, but now willing to try. Will prescribe.  RTC in 1 year.     Rubie Maid, MD [facility]

## 2016-07-19 NOTE — Patient Instructions (Signed)
Health Maintenance, Female Adopting a healthy lifestyle and getting preventive care can go a long way to promote health and wellness. Talk with your health care provider about what schedule of regular examinations is right for you. This is a good chance for you to check in with your provider about disease prevention and staying healthy. In between checkups, there are plenty of things you can do on your own. Experts have done a lot of research about which lifestyle changes and preventive measures are most likely to keep you healthy. Ask your health care provider for more information. WEIGHT AND DIET  Eat a healthy diet  Be sure to include plenty of vegetables, fruits, low-fat dairy products, and lean protein.  Do not eat a lot of foods high in solid fats, added sugars, or salt.  Get regular exercise. This is one of the most important things you can do for your health.  Most adults should exercise for at least 150 minutes each week. The exercise should increase your heart rate and make you sweat (moderate-intensity exercise).  Most adults should also do strengthening exercises at least twice a week. This is in addition to the moderate-intensity exercise.  Maintain a healthy weight  Body mass index (BMI) is a measurement that can be used to identify possible weight problems. It estimates body fat based on height and weight. Your health care provider can help determine your BMI and help you achieve or maintain a healthy weight.  For females 20 years of age and older:   A BMI below 18.5 is considered underweight.  A BMI of 18.5 to 24.9 is normal.  A BMI of 25 to 29.9 is considered overweight.  A BMI of 30 and above is considered obese.  Watch levels of cholesterol and blood lipids  You should start having your blood tested for lipids and cholesterol at 41 years of age, then have this test every 5 years.  You may need to have your cholesterol levels checked more often if:  Your lipid  or cholesterol levels are high.  You are older than 41 years of age.  You are at high risk for heart disease.  CANCER SCREENING   Lung Cancer  Lung cancer screening is recommended for adults 55-80 years old who are at high risk for lung cancer because of a history of smoking.  A yearly low-dose CT scan of the lungs is recommended for people who:  Currently smoke.  Have quit within the past 15 years.  Have at least a 30-pack-year history of smoking. A pack year is smoking an average of one pack of cigarettes a day for 1 year.  Yearly screening should continue until it has been 15 years since you quit.  Yearly screening should stop if you develop a health problem that would prevent you from having lung cancer treatment.  Breast Cancer  Practice breast self-awareness. This means understanding how your breasts normally appear and feel.  It also means doing regular breast self-exams. Let your health care provider know about any changes, no matter how small.  If you are in your 20s or 30s, you should have a clinical breast exam (CBE) by a health care provider every 1-3 years as part of a regular health exam.  If you are 40 or older, have a CBE every year. Also consider having a breast X-ray (mammogram) every year.  If you have a family history of breast cancer, talk to your health care provider about genetic screening.  If you   are at high risk for breast cancer, talk to your health care provider about having an MRI and a mammogram every year.  Breast cancer gene (BRCA) assessment is recommended for women who have family members with BRCA-related cancers. BRCA-related cancers include:  Breast.  Ovarian.  Tubal.  Peritoneal cancers.  Results of the assessment will determine the need for genetic counseling and BRCA1 and BRCA2 testing. Cervical Cancer Your health care provider may recommend that you be screened regularly for cancer of the pelvic organs (ovaries, uterus, and  vagina). This screening involves a pelvic examination, including checking for microscopic changes to the surface of your cervix (Pap test). You may be encouraged to have this screening done every 3 years, beginning at age 21.  For women ages 30-65, health care providers may recommend pelvic exams and Pap testing every 3 years, or they may recommend the Pap and pelvic exam, combined with testing for human papilloma virus (HPV), every 5 years. Some types of HPV increase your risk of cervical cancer. Testing for HPV may also be done on women of any age with unclear Pap test results.  Other health care providers may not recommend any screening for nonpregnant women who are considered low risk for pelvic cancer and who do not have symptoms. Ask your health care provider if a screening pelvic exam is right for you.  If you have had past treatment for cervical cancer or a condition that could lead to cancer, you need Pap tests and screening for cancer for at least 20 years after your treatment. If Pap tests have been discontinued, your risk factors (such as having a new sexual partner) need to be reassessed to determine if screening should resume. Some women have medical problems that increase the chance of getting cervical cancer. In these cases, your health care provider may recommend more frequent screening and Pap tests. Colorectal Cancer  This type of cancer can be detected and often prevented.  Routine colorectal cancer screening usually begins at 41 years of age and continues through 41 years of age.  Your health care provider may recommend screening at an earlier age if you have risk factors for colon cancer.  Your health care provider may also recommend using home test kits to check for hidden blood in the stool.  A small camera at the end of a tube can be used to examine your colon directly (sigmoidoscopy or colonoscopy). This is done to check for the earliest forms of colorectal  cancer.  Routine screening usually begins at age 50.  Direct examination of the colon should be repeated every 5-10 years through 41 years of age. However, you may need to be screened more often if early forms of precancerous polyps or small growths are found. Skin Cancer  Check your skin from head to toe regularly.  Tell your health care provider about any new moles or changes in moles, especially if there is a change in a mole's shape or color.  Also tell your health care provider if you have a mole that is larger than the size of a pencil eraser.  Always use sunscreen. Apply sunscreen liberally and repeatedly throughout the day.  Protect yourself by wearing long sleeves, pants, a wide-brimmed hat, and sunglasses whenever you are outside. HEART DISEASE, DIABETES, AND HIGH BLOOD PRESSURE   High blood pressure causes heart disease and increases the risk of stroke. High blood pressure is more likely to develop in:  People who have blood pressure in the high end   of the normal range (130-139/85-89 mm Hg).  People who are overweight or obese.  People who are African American.  If you are 38-23 years of age, have your blood pressure checked every 3-5 years. If you are 61 years of age or older, have your blood pressure checked every year. You should have your blood pressure measured twice--once when you are at a hospital or clinic, and once when you are not at a hospital or clinic. Record the average of the two measurements. To check your blood pressure when you are not at a hospital or clinic, you can use:  An automated blood pressure machine at a pharmacy.  A home blood pressure monitor.  If you are between 45 years and 39 years old, ask your health care provider if you should take aspirin to prevent strokes.  Have regular diabetes screenings. This involves taking a blood sample to check your fasting blood sugar level.  If you are at a normal weight and have a low risk for diabetes,  have this test once every three years after 41 years of age.  If you are overweight and have a high risk for diabetes, consider being tested at a younger age or more often. PREVENTING INFECTION  Hepatitis B  If you have a higher risk for hepatitis B, you should be screened for this virus. You are considered at high risk for hepatitis B if:  You were born in a country where hepatitis B is common. Ask your health care provider which countries are considered high risk.  Your parents were born in a high-risk country, and you have not been immunized against hepatitis B (hepatitis B vaccine).  You have HIV or AIDS.  You use needles to inject street drugs.  You live with someone who has hepatitis B.  You have had sex with someone who has hepatitis B.  You get hemodialysis treatment.  You take certain medicines for conditions, including cancer, organ transplantation, and autoimmune conditions. Hepatitis C  Blood testing is recommended for:  Everyone born from [birth year] through 1966.  Anyone with known risk factors for hepatitis C. Sexually transmitted infections (STIs)  You should be screened for sexually transmitted infections (STIs) including gonorrhea and chlamydia if:  You are sexually active and are younger than 41 years of age.  You are older than 41 years of age and your health care provider tells you that you are at risk for this type of infection.  Your sexual activity has changed since you were last screened and you are at an increased risk for chlamydia or gonorrhea. Ask your health care provider if you are at risk.  If you do not have HIV, but are at risk, it may be recommended that you take a prescription medicine daily to prevent HIV infection. This is called pre-exposure prophylaxis (PrEP). You are considered at risk if:  You are sexually active and do not regularly use condoms or know the HIV status of your partner(s).  You take drugs by injection.  You are sexually  active with a partner who has HIV. Talk with your health care provider about whether you are at high risk of being infected with HIV. If you choose to begin PrEP, you should first be tested for HIV. You should then be tested every 3 months for as long as you are taking PrEP.  PREGNANCY   If you are premenopausal and you may become pregnant, ask your health care provider about preconception counseling.  If you may  become pregnant, take 400 to 800 micrograms (mcg) of folic acid every day.  If you want to prevent pregnancy, talk to your health care provider about birth control (contraception). OSTEOPOROSIS AND MENOPAUSE   Osteoporosis is a disease in which the bones lose minerals and strength with aging. This can result in serious bone fractures. Your risk for osteoporosis can be identified using a bone density scan.  If you are 61 years of age or older, or if you are at risk for osteoporosis and fractures, ask your health care provider if you should be screened.  Ask your health care provider whether you should take a calcium or vitamin D supplement to lower your risk for osteoporosis.  Menopause may have certain physical symptoms and risks.  Hormone replacement therapy may reduce some of these symptoms and risks. Talk to your health care provider about whether hormone replacement therapy is right for you.  HOME CARE INSTRUCTIONS   Schedule regular health, dental, and eye exams.  Stay current with your immunizations.   Do not use any tobacco products including cigarettes, chewing tobacco, or electronic cigarettes.  If you are pregnant, do not drink alcohol.  If you are breastfeeding, limit how much and how often you drink alcohol.  Limit alcohol intake to no more than 1 drink per day for nonpregnant women. One drink equals 12 ounces of beer, 5 ounces of wine, or 1 ounces of hard liquor.  Do not use street drugs.  Do not share needles.  Ask your health care provider for help if  you need support or information about quitting drugs.  Tell your health care provider if you often feel depressed.  Tell your health care provider if you have ever been abused or do not feel safe at home.   This information is not intended to replace advice given to you by your health care provider. Make sure you discuss any questions you have with your health care provider.   Document Released: 06/11/2011 Document Revised: 12/17/2014 Document Reviewed: 10/28/2013 Elsevier Interactive Patient Education [employer].

## 2016-07-24 ENCOUNTER — Other Ambulatory Visit: Payer: Self-pay

## 2016-07-24 DIAGNOSIS — F329 Major depressive disorder, single episode, unspecified: Secondary | ICD-10-CM

## 2016-07-24 DIAGNOSIS — M79673 Pain in unspecified foot: Secondary | ICD-10-CM

## 2016-07-24 DIAGNOSIS — Z3040 Encounter for surveillance of contraceptives, unspecified: Secondary | ICD-10-CM

## 2016-07-24 DIAGNOSIS — L678 Other hair color and hair shaft abnormalities: Secondary | ICD-10-CM

## 2016-07-24 DIAGNOSIS — J3089 Other allergic rhinitis: Secondary | ICD-10-CM

## 2016-07-24 DIAGNOSIS — T782XXD Anaphylactic shock, unspecified, subsequent encounter: Secondary | ICD-10-CM

## 2016-07-24 DIAGNOSIS — F419 Anxiety disorder, unspecified: Secondary | ICD-10-CM

## 2016-07-24 DIAGNOSIS — F32A Depression, unspecified: Secondary | ICD-10-CM

## 2016-07-24 MED ORDER — FLUNISOLIDE 25 MCG/ACT (0.025%) NA SOLN: NASAL | 3 refills | Status: DC

## 2016-07-24 MED ORDER — EFLORNITHINE HCL 13.9 % EX CREA: 13.9000 mg | TOPICAL_CREAM | Freq: Two times a day (BID) | CUTANEOUS | 3 refills | Status: DC

## 2016-07-24 MED ORDER — EPINEPHRINE 0.3 MG/0.3ML IJ SOAJ: INTRAMUSCULAR | 99 refills | Status: AC

## 2016-07-24 MED ORDER — GABAPENTIN 100 MG PO CAPS: 100.0000 mg | ORAL_CAPSULE | Freq: Every day | ORAL | 11 refills | Status: AC

## 2016-07-24 MED ORDER — DROSPIRENONE-ETHINYL ESTRADIOL 3-0.03 MG PO TABS: 1.0000 | ORAL_TABLET | Freq: Every day | ORAL | 1 refills | Status: DC

## 2016-07-24 MED ORDER — SERTRALINE HCL 25 MG PO TABS: 25.0000 mg | ORAL_TABLET | Freq: Every day | ORAL | 11 refills | Status: DC

## 2016-07-24 MED ORDER — SPIRONOLACTONE 100 MG PO TABS: 100.0000 mg | ORAL_TABLET | Freq: Every day | ORAL | 11 refills | Status: AC

## 2016-07-25 ENCOUNTER — Telehealth: Payer: Self-pay | Admitting: Obstetrics and Gynecology

## 2016-07-25 NOTE — Telephone Encounter (Signed)
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

## 2016-07-26 NOTE — Telephone Encounter (Signed)
Prescriptions faxed to [phone], prescriptions also mailed to pt.

## 2016-09-27 ENCOUNTER — Telehealth: Payer: Self-pay | Admitting: Obstetrics and Gynecology

## 2016-09-27 NOTE — Telephone Encounter (Signed)
PT CALLED AND SHE NEEDS A NOTE FOR THE [facility] DOCTOR THAT SHE SEES DR Nadara Mustard STATING THE DIAGNOSIS AND WHY SHE NEEDS THE CREAM WHICH IS FOR THE PCOS AND THE BC PILLS. SHE WANTED TO SEE IF YOU COULD FAX THE LETTER TO DR Nadara Mustard AT [phone].

## 2016-09-28 NOTE — Telephone Encounter (Signed)
Information faxed, pt notified via mychart

## 2016-10-07 ENCOUNTER — Other Ambulatory Visit: Payer: Self-pay | Admitting: Obstetrics and Gynecology

## 2016-10-09 ENCOUNTER — Telehealth: Payer: Self-pay | Admitting: Obstetrics and Gynecology

## 2016-10-09 DIAGNOSIS — L678 Other hair color and hair shaft abnormalities: Secondary | ICD-10-CM

## 2016-10-09 DIAGNOSIS — Z3041 Encounter for surveillance of contraceptive pills: Secondary | ICD-10-CM

## 2016-10-09 MED ORDER — EFLORNITHINE HCL 13.9 % EX CREA
13.9000 mg | TOPICAL_CREAM | Freq: Two times a day (BID) | CUTANEOUS | 3 refills | Status: DC
Start: 2016-10-09 — End: 2018-02-04

## 2016-10-09 MED ORDER — DROSPIRENONE-ETHINYL ESTRADIOL 3-0.03 MG PO TABS: 1.0000 | ORAL_TABLET | Freq: Every day | ORAL | 3 refills | Status: DC

## 2016-10-09 NOTE — Telephone Encounter (Signed)
Patient called stating she needs her birth control and facial cream sent to the [facility] in [place]. She said those are the only 2 scripts that the [place] would not do.Thanks

## 2016-10-09 NOTE — Telephone Encounter (Signed)
Done

## 2017-01-03 ENCOUNTER — Other Ambulatory Visit: Payer: Self-pay | Admitting: Obstetrics and Gynecology

## 2017-03-07 ENCOUNTER — Other Ambulatory Visit: Payer: Self-pay | Admitting: Obstetrics and Gynecology

## 2017-07-23 ENCOUNTER — Encounter: Payer: Self-pay | Admitting: Obstetrics and Gynecology

## 2017-07-23 ENCOUNTER — Ambulatory Visit (INDEPENDENT_AMBULATORY_CARE_PROVIDER_SITE_OTHER): Payer: BLUE CROSS/BLUE SHIELD | Admitting: Obstetrics and Gynecology

## 2017-07-23 VITALS — BP 112/77 | HR 76 | Ht 65.0 in | Wt 221.6 lb

## 2017-07-23 DIAGNOSIS — E282 Polycystic ovarian syndrome: Secondary | ICD-10-CM | POA: Diagnosis not present

## 2017-07-23 DIAGNOSIS — E079 Disorder of thyroid, unspecified: Secondary | ICD-10-CM | POA: Diagnosis not present

## 2017-07-23 DIAGNOSIS — Z01419 Encounter for gynecological examination (general) (routine) without abnormal findings: Secondary | ICD-10-CM | POA: Diagnosis not present

## 2017-07-23 DIAGNOSIS — D259 Leiomyoma of uterus, unspecified: Secondary | ICD-10-CM

## 2017-07-23 DIAGNOSIS — E669 Obesity, unspecified: Secondary | ICD-10-CM | POA: Diagnosis not present

## 2017-07-23 DIAGNOSIS — N939 Abnormal uterine and vaginal bleeding, unspecified: Secondary | ICD-10-CM | POA: Diagnosis not present

## 2017-07-23 NOTE — Progress Notes (Signed)
Pt is here for an annual physical however she has been bleeding on and off since May. Is using OCPs.

## 2017-07-23 NOTE — Patient Instructions (Addendum)
Preventive Care 18-39 Years, Female Preventive care refers to lifestyle choices and visits with your health care provider that can promote health and wellness. What does preventive care include?  A yearly physical exam. This is also called an annual well check.  Dental exams once or twice a year.  Routine eye exams. Ask your health care provider how often you should have your eyes checked.  Personal lifestyle choices, including: ? Daily care of your teeth and gums. ? Regular physical activity. ? Eating a healthy diet. ? Avoiding tobacco and drug use. ? Limiting alcohol use. ? Practicing safe sex. ? Taking vitamin and mineral supplements as recommended by your health care provider. What happens during an annual well check? The services and screenings done by your health care provider during your annual well check will depend on your age, overall health, lifestyle risk factors, and family history of disease. Counseling Your health care provider may ask you questions about your:  Alcohol use.  Tobacco use.  Drug use.  Emotional well-being.  Home and relationship well-being.  Sexual activity.  Eating habits.  Work and work Statistician.  Method of birth control.  Menstrual cycle.  Pregnancy history.  Screening You may have the following tests or measurements:  Height, weight, and BMI.  Diabetes screening. This is done by checking your blood sugar (glucose) after you have not eaten for a while (fasting).  Blood pressure.  Lipid and cholesterol levels. These may be checked every 5 years starting at age 38.  Skin check.  Hepatitis C blood test.  Hepatitis B blood test.  Sexually transmitted disease (STD) testing.  BRCA-related cancer screening. This may be done if you have a family history of breast, ovarian, tubal, or peritoneal cancers.  Pelvic exam and Pap test. This may be done every 3 years starting at age 38. Starting at age 30, this may be done  every 5 years if you have a Pap test in combination with an HPV test.  Discuss your test results, treatment options, and if necessary, the need for more tests with your health care provider. Vaccines Your health care provider may recommend certain vaccines, such as:  Influenza vaccine. This is recommended every year.  Tetanus, diphtheria, and acellular pertussis (Tdap, Td) vaccine. You may need a Td booster every 10 years.  Varicella vaccine. You may need this if you have not been vaccinated.  HPV vaccine. If you are 39 or younger, you may need three doses over 6 months.  Measles, mumps, and rubella (MMR) vaccine. You may need at least one dose of MMR. You may also need a second dose.  Pneumococcal 13-valent conjugate (PCV13) vaccine. You may need this if you have certain conditions and were not previously vaccinated.  Pneumococcal polysaccharide (PPSV23) vaccine. You may need one or two doses if you smoke cigarettes or if you have certain conditions.  Meningococcal vaccine. One dose is recommended if you are age 68-21 years and a first-year college student living in a residence hall, or if you have one of several medical conditions. You may also need additional booster doses.  Hepatitis A vaccine. You may need this if you have certain conditions or if you travel or work in places where you may be exposed to hepatitis A.  Hepatitis B vaccine. You may need this if you have certain conditions or if you travel or work in places where you may be exposed to hepatitis B.  Haemophilus influenzae type b (Hib) vaccine. You may need this  if you have certain risk factors.  Talk to your health care provider about which screenings and vaccines you need and how often you need them. This information is not intended to replace advice given to you by your health care provider. Make sure you discuss any questions you have with your health care provider. Document Released: 01/22/2002 Document Revised:  08/15/2016 Document Reviewed: 09/27/2015 Elsevier Interactive Patient Education  2017 Elsevier Inc.  Uterine Fibroids Uterine fibroids are tissue masses (tumors) that can develop in the womb (uterus). They are also called leiomyomas. This type of tumor is not cancerous (benign) and does not spread to other parts of the body outside of the pelvic area, which is between the hip bones. Occasionally, fibroids may develop in the fallopian tubes, in the cervix, or on the support structures (ligaments) that surround the uterus. You can have one or many fibroids. Fibroids can vary in size, weight, and where they grow in the uterus. Some can become quite large. Most fibroids do not require medical treatment. What are the causes? A fibroid can develop when a single uterine cell keeps growing (replicating). Most cells in the human body have a control mechanism that keeps them from replicating without control. What are the signs or symptoms? Symptoms may include:  Heavy bleeding during your period.  Bleeding or spotting between periods.  Pelvic pain and pressure.  Bladder problems, such as needing to urinate more often (urinary frequency) or urgently.  Inability to reproduce offspring (infertility).  Miscarriages.  How is this diagnosed? Uterine fibroids are diagnosed through a physical exam. Your health care provider may feel the lumpy tumors during a pelvic exam. Ultrasonography and an MRI may be done to determine the size, location, and number of fibroids. How is this treated? Treatment may include:  Watchful waiting. This involves getting the fibroid checked by your health care provider to see if it grows or shrinks. Follow your health care provider's recommendations for how often to have this checked.  Hormone medicines. These can be taken by mouth or given through an intrauterine device (IUD).  Surgery. ? Removing the fibroids (myomectomy) or the uterus (hysterectomy). ? Removing blood  supply to the fibroids (uterine artery embolization).  If fibroids interfere with your fertility and you want to become pregnant, your health care provider may recommend having the fibroids removed. Follow these instructions at home:  Keep all follow-up visits as directed by your health care provider. This is important.  Take over-the-counter and prescription medicines only as told by your health care provider. ? If you were prescribed a hormone treatment, take the hormone medicines exactly as directed.  Ask your health care provider about taking iron pills and increasing the amount of dark green, leafy vegetables in your diet. These actions can help to boost your blood iron levels, which may be affected by heavy menstrual bleeding.  Pay close attention to your period and tell your health care provider about any changes, such as: ? Increased blood flow that requires you to use more pads or tampons than usual per month. ? A change in the number of days that your period lasts per month. ? A change in symptoms that are associated with your period, such as abdominal cramping or back pain. Contact a health care provider if:  You have pelvic pain, back pain, or abdominal cramps that cannot be controlled with medicines.  You have an increase in bleeding between and during periods.  You soak tampons or pads in a half hour  or less.  You feel lightheaded, extra tired, or weak. Get help right away if:  You faint.  You have a sudden increase in pelvic pain. This information is not intended to replace advice given to you by your health care provider. Make sure you discuss any questions you have with your health care provider. Document Released: 11/23/2000 Document Revised: 07/26/2016 Document Reviewed: 05/25/2014 Elsevier Interactive Patient Education  [employer].

## 2017-07-23 NOTE — Progress Notes (Addendum)
GYNECOLOGY ANNUAL PHYSICAL EXAM PROGRESS NOTE  Subjective:    Marissa Sandoval is a 41 y.o. G23P3003 female with h/o PCOS and fibroid uterus who presents for an annual exam. The patient is sexually active. The patient wears seatbelts: yes. The patient participates in regular exercise: yes. Has the patient ever been transfused or tattooed?: no. The patient reports that there is not domestic violence in her life.    The patient has the following complaints today:  1.  Marissa Sandoval states that she is exercising and went to a  vegan/vegetarian diet last year, but has only lost 5-6 lbs. Has tried several natural weight loss programs without success.  2.  She reports that her abnormal periods have started occurring again.  She was previously being well-managed on her combined OCPs for the past 2 years, however she has been noting bleeding off and on since May.  Notes almost daily vaginal bleeding or spotting.     Gynecologic History  Menarche age: 34 No LMP recorded. Patient is not currently having periods (Reason: OCPs). Contraception: OCP (estrogen/progesterone) History of STI's: Denies Last Pap: 2015. Results were: normal.  Reports h/o abnormal pap smear in 2009. Last mammogram: 2016 (performed in [place]). Results were: normal   Obstetric History   G3   P3   T3   P0   A0   L3    SAB0   TAB0   Ectopic0   Multiple0   Live Births3     # Outcome Date GA Lbr Len/2nd Weight Sex Delivery Anes PTL Lv  3 Term      Vag-Spont  N LIV  2 Term      Vag-Spont  N LIV  1 Term      Vag-Spont  N LIV      Past Medical History:  Diagnosis Date  . Allergy   . Anemia   . Anxiety   . Arthritis   . Depression   . Fibroid uterus   . PCOS (polycystic ovarian syndrome)   . PCOS (polycystic ovarian syndrome)   . Thyroid disease   . Vaginal Pap smear, abnormal 2009    Past Surgical History:  Procedure Laterality Date  . LAPAROSCOPIC CHOLECYSTECTOMY  2010  . NASAL SINUS SURGERY  2000    Family  History  Problem Relation Age of Onset  . Diabetes Mother   . Hypertension Mother   . Thyroid disease Mother   . Stomach cancer Father   . Colon cancer Neg Hx   . Esophageal cancer Neg Hx   . Rectal cancer Neg Hx     Social History   Social History  . Marital status: Single    Spouse name: N/A  . Number of children: N/A  . Years of education: N/A   Occupational History  . Not on file.   Social History Main Topics  . Smoking status: Never Smoker  . Smokeless tobacco: Never Used  . Alcohol use No  . Drug use: No  . Sexual activity: Yes    Birth control/ protection: Pill   Other Topics Concern  . Not on file   Social History Narrative  . No narrative on file    Current Outpatient Prescriptions on File Prior to Visit  Medication Sig Dispense Refill  . Cetirizine HCl 10 MG CAPS Take by mouth.    . drospirenone-ethinyl estradiol (ZARAH) 3-0.03 MG tablet Take 1 tablet by mouth daily. 84 tablet 3  . EPINEPHrine 0.3 mg/0.3 mL IJ SOAJ  injection INJECT INTRAMUSCULARLY AS DIRECTED 1 Device 99  . flunisolide (NASALIDE) 25 MCG/ACT (0.025%) SOLN 2 (two) times daily. 1 Bottle 3  . gabapentin (NEURONTIN) 100 MG capsule Take 1 capsule (100 mg total) by mouth at bedtime. 30 capsule 11  . Multiple Vitamins-Minerals (MULTIVITAMIN ADULT PO) Take 1 capsule by mouth daily.    . sertraline (ZOLOFT) 25 MG tablet Take 1 tablet (25 mg total) by mouth daily. 30 tablet 11  . spironolactone (ALDACTONE) 100 MG tablet Take 1 tablet (100 mg total) by mouth daily. 30 tablet 11  . ZARAH 3-0.03 MG tablet TAKE 1 TABLET BY MOUTH EVERY DAY 84 tablet 2  . B Complex Vitamins (B COMPLEX PO) Take 1 capsule by mouth daily.    . Eflornithine HCl (VANIQA) 13.9 % cream Apply 13.9 mg topically 2 (two) times daily at 8 am and 10 pm. (Patient not taking: Reported on 07/23/2017) 30 g 3  . Ferrous Gluconate-C-Folic Acid (IRON-C PO) Take by mouth.    Marland Kitchen GLUCOSAMINE HCL PO Take by mouth daily.    . Menthol-Methyl  Salicylate ([place] EX) Apply topically.    . metFORMIN (GLUCOPHAGE) 500 MG tablet TK 1 T PO D  5   No current facility-administered medications on file prior to visit.     Allergies  Allergen Reactions  . Latex Itching and Rash  . Penicillins Nausea And Vomiting    Review of Systems Constitutional: negative for chills, fatigue, fevers and sweats Eyes: negative for irritation, redness and visual disturbance Ears, nose, mouth, throat, and face: negative for hearing loss, nasal congestion, snoring and tinnitus Respiratory: negative for asthma, cough, sputum Cardiovascular: negative for chest pain, dyspnea, exertional chest pressure/discomfort, irregular heart beat, palpitations and syncope Gastrointestinal: negative for abdominal pain, change in bowel habits, nausea and vomiting Genitourinary: positive for abnormal menstrual periods. Negative forgenital lesions, sexual problems and vaginal discharge, dysuria and urinary incontinence Integument/breast: negative for breast lump, breast tenderness and nipple discharge.  Hair growth under chin and lower mandible has increased. Is undergoing electrolysis treatments at the [place].  Hematologic/lymphatic: negative for bleeding and easy bruising Musculoskeletal:negative for back pain and muscle weakness Neurological: negative for dizziness, headaches, vertigo and weakness Endocrine: negative for diabetic symptoms including polydipsia, polyuria and skin dryness Allergic/Immunologic: negative for hay fever and urticaria      Objective:  Blood pressure 112/77, pulse 76, height 5\' 5"  (1.651 m), weight 221 lb 9 oz (100.5 kg). Body mass index is 36.87 kg/m.  General Appearance:    Alert, cooperative, no distress, appears stated age, obese  Head:    Normocephalic, without obvious abnormality, atraumatic  Eyes:    PERRL, conjunctiva/corneas clear, EOM's intact, both eyes  Ears:    Normal external ear canals, both ears  Nose:   Nares normal, septum  midline, mucosa normal, no drainage or sinus tenderness  Throat:   Lips, mucosa, and tongue normal; teeth and gums normal  Neck:   Supple, symmetrical, trachea midline, no adenopathy; thyroid: no enlargement/tenderness/nodules; no carotid bruit or JVD  Back:     Symmetric, no curvature, ROM normal, no CVA tenderness  Lungs:     Clear to auscultation bilaterally, respirations unlabored  Chest Wall:    No tenderness or deformity   Heart:    Regular rate and rhythm, S1 and S2 normal, no murmur, rub or gallop  Breast Exam:    No tenderness, masses, or nipple abnormality  Abdomen:     Soft, non-tender, bowel sounds active all four quadrants, no  masses, no organomegaly.    Genitalia:    Pelvic:external genitalia normal, vagina without lesions, discharge, or tenderness, rectovaginal septum  normal. Cervix displaced posteriorly and to the left, normal in appearance, no cervical motion tenderness, no adnexal masses or tenderness.  Uterus normal size, shape, mobile, but large posterior firm smooth mass palpable, ~ 4-5 cm, mildly tender (suspected posterior fibroid).  Rectal:    Normal external sphincter.  No hemorrhoids appreciated. Internal exam not done.   Extremities:   Extremities normal, atraumatic, no cyanosis or edema  Pulses:   2+ and symmetric all extremities  Skin:   Skin color, texture, turgor normal, no rashes or lesions.  Small amount of facial hair along chin and right jaw region  Lymph nodes:   Cervical, supraclavicular, and axillary nodes normal  Neurologic:   CNII-XII intact, normal strength, sensation and reflexes throughout   . Labs:  Lab Results  Component Value Date   WBC 5.0 07/15/2015   HGB 13.4 07/15/2015   HCT 39.9 07/15/2015   MCV 90 07/15/2015   PLT 256 07/15/2015    Lab Results  Component Value Date   CREATININE 0.90 07/15/2015   BUN 14 07/15/2015   NA 140 07/15/2015   K 4.3 07/15/2015   CL 102 07/15/2015   CO2 25 07/15/2015    Lab Results  Component Value  Date   ALT 15 07/15/2015   AST 19 07/15/2015   ALKPHOS 51 07/15/2015   BILITOT 0.4 07/15/2015    Lab Results  Component Value Date   TSH 0.388 (L) 07/15/2015     Assessment:   Routine gynecologic exam.  PCOS Dysfunctional uterine bleeding Fibroids Obesity Thyroid disease   Plan:    - Labs performed today: See orders  - Breast self exam technique reviewed and patient encouraged to perform self-exam monthly. - Discussed healthy lifestyle modifications. - Mammogram ordered.  - Pap smear performed today, continue routine screening.   - Discussion had with management of PCOS and abnormal bleeding.  OCPs were working well in the past, however notes that she is now having almost daily bleeding over the past few months. Discussed management options, including hormonal (trying higher dose OCP, switching to progesterone-only method such as pills, Depo Provera, Mirena IUD), or surgical management with endometrial ablation or hysterectomy.  Patient would like to do Mirena IUD.   - Based on today's exam, patient has a moderately sized posterior fibroid. Patient has a history  notes it has been several years since her last pelvic ultrasound.Will order ultrasound to assess location and depth of the fibroid, to assess if an IUD can be placed.  - Fibroid uterus present,  Will perform to reassess size of uterine fibroids.  - Once bleeding better controlled, with refer to Lorelle Gibbs, CNM for weight management options.  Patient does note that she has used Phentermine in the remote past, but would like to discuss all options.  - Will recheck thyroid levels as this could be cause of abnormal bleeding if abnormal.  - RTC in 1 week for ultrasound and IUD insertion if permitted by results.  F/u in 1 year for annual exam.    Rubie Maid, MD [facility]

## 2017-07-25 LAB — PAP IG AND HPV HIGH-RISK
HPV, high-risk: NEGATIVE
PAP Smear Comment: 0

## 2017-07-30 LAB — CBC
HEMATOCRIT: 41 % (ref 34.0–46.6)
Hemoglobin: 14.2 g/dL (ref 11.1–15.9)
MCH: 30.3 pg (ref 26.6–33.0)
MCHC: 34.6 g/dL (ref 31.5–35.7)
MCV: 87 fL (ref 79–97)
PLATELETS: 281 10*3/uL (ref 150–379)
RBC: 4.69 x10E6/uL (ref 3.77–5.28)
RDW: 13.9 % (ref 12.3–15.4)
WBC: 5 10*3/uL (ref 3.4–10.8)

## 2017-07-30 LAB — COMPREHENSIVE METABOLIC PANEL
A/G RATIO: 1.6 (ref 1.2–2.2)
ALBUMIN: 4.4 g/dL (ref 3.5–5.5)
ALT: 13 IU/L (ref 0–32)
AST: 19 IU/L (ref 0–40)
Alkaline Phosphatase: 53 IU/L (ref 39–117)
BILIRUBIN TOTAL: 0.3 mg/dL (ref 0.0–1.2)
BUN / CREAT RATIO: 11 (ref 9–23)
BUN: 10 mg/dL (ref 6–24)
CHLORIDE: 107 mmol/L — AB (ref 96–106)
CO2: 20 mmol/L (ref 20–29)
Calcium: 9 mg/dL (ref 8.7–10.2)
Creatinine, Ser: 0.88 mg/dL (ref 0.57–1.00)
GFR calc non Af Amer: 81 mL/min/{1.73_m2} (ref >59)
GFR, EST AFRICAN AMERICAN: 94 mL/min/{1.73_m2} (ref >59)
GLOBULIN, TOTAL: 2.8 g/dL (ref 1.5–4.5)
Glucose: 96 mg/dL (ref 65–99)
POTASSIUM: 4.3 mmol/L (ref 3.5–5.2)
SODIUM: 140 mmol/L (ref 134–144)
Total Protein: 7.2 g/dL (ref 6.0–8.5)

## 2017-07-30 LAB — INSULIN, FREE AND TOTAL
FREE INSULIN: 17 uU/mL
Total Insulin: 17 uU/mL

## 2017-07-30 LAB — TSH: TSH: 0.76 u[IU]/mL (ref 0.450–4.500)

## 2017-07-30 LAB — LIPID PANEL
CHOLESTEROL TOTAL: 158 mg/dL (ref 100–199)
Chol/HDL Ratio: 2 ratio (ref 0.0–4.4)
HDL: 78 mg/dL (ref >39)
LDL CALC: 53 mg/dL (ref 0–99)
TRIGLYCERIDES: 136 mg/dL (ref 0–149)
VLDL Cholesterol Cal: 27 mg/dL (ref 5–40)

## 2017-07-30 LAB — ESTRADIOL: Estradiol: <5 pg/mL

## 2017-07-30 LAB — HEMOGLOBIN A1C
ESTIMATED AVERAGE GLUCOSE: 117 mg/dL
Hgb A1c MFr Bld: 5.7 % — ABNORMAL HIGH (ref 4.8–5.6)

## 2017-07-30 LAB — TESTOSTERONE, FREE, TOTAL, SHBG
Sex Hormone Binding: 284.9 nmol/L — ABNORMAL HIGH (ref 24.6–122.0)
Testosterone, Free: <0.2 pg/mL (ref 0.0–4.2)
Testosterone: <3 ng/dL — ABNORMAL LOW (ref 8–48)

## 2017-07-30 LAB — FSH/LH
FSH: 4.1 m[IU]/mL
LH: 1.7 m[IU]/mL

## 2017-07-30 LAB — PROGESTERONE: Progesterone: 0.2 ng/mL

## 2017-07-30 LAB — VITAMIN B12: VITAMIN B 12: 560 pg/mL (ref 232–1245)

## 2017-07-30 LAB — VITAMIN D 25 HYDROXY (VIT D DEFICIENCY, FRACTURES): VIT D 25 HYDROXY: 45 ng/mL (ref 30.0–100.0)

## 2017-08-02 ENCOUNTER — Encounter: Payer: Self-pay | Admitting: Obstetrics and Gynecology

## 2017-08-02 ENCOUNTER — Ambulatory Visit (INDEPENDENT_AMBULATORY_CARE_PROVIDER_SITE_OTHER): Payer: BLUE CROSS/BLUE SHIELD | Admitting: Obstetrics and Gynecology

## 2017-08-02 ENCOUNTER — Ambulatory Visit (INDEPENDENT_AMBULATORY_CARE_PROVIDER_SITE_OTHER): Payer: BLUE CROSS/BLUE SHIELD

## 2017-08-02 VITALS — BP 118/74 | HR 79 | Ht 65.0 in | Wt 225.5 lb

## 2017-08-02 DIAGNOSIS — E282 Polycystic ovarian syndrome: Secondary | ICD-10-CM | POA: Diagnosis not present

## 2017-08-02 DIAGNOSIS — N938 Other specified abnormal uterine and vaginal bleeding: Secondary | ICD-10-CM | POA: Diagnosis not present

## 2017-08-02 DIAGNOSIS — D259 Leiomyoma of uterus, unspecified: Secondary | ICD-10-CM

## 2017-08-02 DIAGNOSIS — R102 Pelvic and perineal pain: Secondary | ICD-10-CM

## 2017-08-02 DIAGNOSIS — E669 Obesity, unspecified: Secondary | ICD-10-CM | POA: Diagnosis not present

## 2017-08-02 NOTE — Progress Notes (Signed)
GYNECOLOGY PROGRESS NOTE  Subjective:    Patient ID: Marissa Sandoval, female    DOB: 02-16-1976, 41 y.o.   MRN: [id number]  HPI  Patient is a 41 y.o. G18P3003 female who presents for f/u of pelvic ultrasound and labs, and possible IUD insertion. Patient has complaints of pelvic pressure and pain, fibroid uterus, and abnormal uterine bleeding.  Patient also has a h/o PCOS. Is planning on IUD insertion today depending upon favorable ultrasound results.   The following portions of the patient's history were reviewed and updated as appropriate: allergies, current medications, past family history, past medical history, past social history, past surgical history and problem list.  Review of Systems Pertinent items noted in HPI and remainder of comprehensive ROS otherwise negative.   Objective:   Blood pressure 118/74, pulse 79, height 5\' 5"  (1.651 m), weight 225 lb 8 oz (102.3 kg), last menstrual period 07/26/2017. General appearance: alert and no distress Remainder of exam deferred.     Labs:  Results for orders placed or performed in visit on 07/23/17  Testosterone, Free, Total, SHBG  Result Value Ref Range   Testosterone <3 (L) 8 - 48 ng/dL   Testosterone, Free <0.2 0.0 - 4.2 pg/mL   Sex Hormone Binding 284.9 (H) 24.6 - 122.0 nmol/L  Insulin, Free and Total  Result Value Ref Range   Free Insulin 17 uU/mL   Total Insulin 17 uU/mL  Progesterone  Result Value Ref Range   Progesterone 0.2 ng/mL  Estradiol  Result Value Ref Range   Estradiol <5.0 pg/mL  FSH/LH  Result Value Ref Range   LH 1.7 mIU/mL   FSH 4.1 mIU/mL  Lipid panel  Result Value Ref Range   Cholesterol, Total 158 100 - 199 mg/dL   Triglycerides 136 0 - 149 mg/dL   HDL 78 >39 mg/dL   VLDL Cholesterol Cal 27 5 - 40 mg/dL   LDL Calculated 53 0 - 99 mg/dL   Chol/HDL Ratio 2.0 0.0 - 4.4 ratio  TSH  Result Value Ref Range   TSH 0.760 0.450 - 4.500 uIU/mL  CBC  Result Value Ref Range   WBC 5.0 3.4 - 10.8  x10E3/uL   RBC 4.69 3.77 - 5.28 x10E6/uL   Hemoglobin 14.2 11.1 - 15.9 g/dL   Hematocrit 41.0 34.0 - 46.6 %   MCV 87 79 - 97 fL   MCH 30.3 26.6 - 33.0 pg   MCHC 34.6 31.5 - 35.7 g/dL   RDW 13.9 12.3 - 15.4 %   Platelets 281 150 - 379 x10E3/uL  Comprehensive metabolic panel  Result Value Ref Range   Glucose 96 65 - 99 mg/dL   BUN 10 6 - 24 mg/dL   Creatinine, Ser 0.88 0.57 - 1.00 mg/dL   GFR calc non Af Amer 81 >59 mL/min/1.73   GFR calc Af Amer 94 >59 mL/min/1.73   BUN/Creatinine Ratio 11 9 - 23   Sodium 140 134 - 144 mmol/L   Potassium 4.3 3.5 - 5.2 mmol/L   Chloride 107 (H) 96 - 106 mmol/L   CO2 20 20 - 29 mmol/L   Calcium 9.0 8.7 - 10.2 mg/dL   Total Protein 7.2 6.0 - 8.5 g/dL   Albumin 4.4 3.5 - 5.5 g/dL   Globulin, Total 2.8 1.5 - 4.5 g/dL   Albumin/Globulin Ratio 1.6 1.2 - 2.2   Bilirubin Total 0.3 0.0 - 1.2 mg/dL   Alkaline Phosphatase 53 39 - 117 IU/L   AST 19 0 -  40 IU/L   ALT 13 0 - 32 IU/L  HgB A1c  Result Value Ref Range   Hgb A1c MFr Bld 5.7 (H) 4.8 - 5.6 %   Est. average glucose Bld gHb Est-mCnc 117 mg/dL  Vitamin D (25 hydroxy)  Result Value Ref Range   Vit D, 25-Hydroxy 45.0 30.0 - 100.0 ng/mL  B12  Result Value Ref Range   Vitamin B-12 560 232 - 1,245 pg/mL  Pap IG and HPV (high risk) DNA detection  Result Value Ref Range   DIAGNOSIS: Comment    Specimen adequacy: Comment    Clinician Provided ICD10 Comment    Performed by: Comment    PAP Smear Comment .    Note: Comment    Test Methodology Comment    HPV, high-risk Negative Negative    Imaging:  ULTRASOUND REPORT  Location: [facility] Date of Service: 08/02/17   Indications: PCOS and Possible Fibroid Findings:  The uterus is anteverted and enlarged and measures 11.0 x 4.5 x 4.9 cm. Echo texture is mostly homogenous with the exception of the cervical/LUS which is focally heterogenous with a suspected posterior fibroid measuring:  Fibroid 1: 9.0 x 8.3 x 8.4 cm. This is  posterior LUS/cervical area is difficult to assess as to whether this is subserosal versus submucosal. Blood flow is seen peripherally and internally.  The Endometrium is thin and appears WNL and measures 3.8 mm.  Right Ovary is not visualized. Right adnexa is surveyed antd appears WNL. Left Ovary measures 3.0 x 1.7 x 2.3 cm. Multiple follicles are noted, however, due to the size of the fibroid, detailed assessment is limited, but the ovary itself appears WNL. The left adnexa was surveyed and appears WNL. There is no free fluid in the cul de sac.  Impression: 1. Large LUS/Cervical posterior fibroid is seen. The uterus otherwise, appears WNL.  Recommendations: 1.Clinical correlation with the patient's History and Physical Exam. 2. Patient has an appointment with Dr. Marcelline Mates today to discuss the results of the ultrasound.  Macarthur Critchley, RDMS, RVT   Assessment:   Pelvic pressure and pain Fibroid uterus Abnormal uterine bleeding H/o PCOS  Plan:   - Reviewed labs and ultrasound findings with patient. As it is difficult to tell whether posterior fibroid is submucosal vs subserosal, and due to size, I informed patient that there was a higher possibility of expulsion, or even difficulties with insertion.  Will defer placement today.  - Discussed again the different management options for abnormal uterine bleeding in the setting of PCOS and fibroid uterus, including tranexamic acid (Lysteda), Depo Provera, Mirena IUD, endometrial ablation (Novasure/Hydrothermal Ablation), uterine fibroid embolization, or hysterectomy as definitive surgical management.  Discussed risks and benefits of each method.   Patient ultimately desires definitive management with hysterectomy, however notes that right now she does not have the time from work.  Discussed use of Lupron during the interim, which may also help decreased the size of the fibroid and change surgical approach (would currently recommend TAH,  but if fibroid decreases in size, may be able to consider LAVH vs TVH). Patient notes that she would be interested in this.  Printed patient education handouts on both hysterectomy and Lupron were given to the patient to review at home.  Will contact patient once Lupron arrives to schedule an appointment to receive injection.     A total of 15 minutes were spent face-to-face with the patient during this encounter and over half of that time dealt with counseling and  coordination of care.   Rubie Maid, MD [facility]

## 2017-08-14 ENCOUNTER — Telehealth: Payer: Self-pay | Admitting: Obstetrics and Gynecology

## 2017-08-14 NOTE — Telephone Encounter (Signed)
Patient called stating she wants to be scheduled for hysterectomy. Have you discussed this with her previously?

## 2017-08-14 NOTE — Telephone Encounter (Signed)
Yes.  We have talked about it.  She initially wanted to try Lupron for 3-6 months before scheduling a hysterectomy as she wasn't sure if she was going to be able to get time from her job for the surgery.  If she does want to go ahead and have the surgery and disregard the trial of Lupron, then she can be scheduled for next available surgery date with me.  She will likely need to come in for a pre-op appointment as I think the surgery schedule is a little far out.

## 2017-08-16 NOTE — Telephone Encounter (Signed)
TAH

## 2017-08-16 NOTE — Telephone Encounter (Signed)
Yes. TAH with bilateral salpingectomy

## 2017-08-30 ENCOUNTER — Encounter: Payer: Self-pay | Admitting: Dietician

## 2017-08-30 ENCOUNTER — Encounter: Payer: BLUE CROSS/BLUE SHIELD | Attending: Obstetrics and Gynecology | Admitting: Dietician

## 2017-08-30 ENCOUNTER — Telehealth: Payer: Self-pay

## 2017-08-30 VITALS — Ht 65.0 in | Wt 229.1 lb

## 2017-08-30 DIAGNOSIS — E6609 Other obesity due to excess calories: Secondary | ICD-10-CM

## 2017-08-30 DIAGNOSIS — Z6838 Body mass index (BMI) 38.0-38.9, adult: Secondary | ICD-10-CM | POA: Diagnosis not present

## 2017-08-30 DIAGNOSIS — E282 Polycystic ovarian syndrome: Secondary | ICD-10-CM

## 2017-08-30 LAB — HM MAMMOGRAPHY

## 2017-08-30 NOTE — Telephone Encounter (Signed)
Called pt informed her that FMLA paperwork had been completed. Pt states she will pick up at her next appt.

## 2017-08-30 NOTE — Patient Instructions (Signed)
   Resume planning ahead for balanced, healthy meals and fewer fast food meals.   Measure some food portions, especially starches. Include generous portions of vegetables and medium portions (palm-size) of meats/ proteins.   Count each 1/2 cup of beans as equal to 1oz protein.   If you use the Lose It app, aim for 1500 calories, 45% carbs (170g), 25% protein (90g), and 30% fat (50g).  Keep up your regular exercise, great job!

## 2017-08-30 NOTE — Progress Notes (Signed)
Medical Nutrition Therapy: Visit start time: 4098  end time: 1130  Assessment:  Diagnosis: obesity, PCOS Past medical history: pre-diabetes, osteoarthritis in back and knees Psychosocial issues/ stress concerns: patient reports high stress level; manages with exercise and time with pet cats Preferred learning method:  . Auditory . Visual . Hands-on  Current weight: 229.1lbs with shoes  Height: 5'5" Medications, supplements: reconciled list in medical record  Progress and evaluation: Patient reports multiple efforts at weight lost; was close to 300lbs several years ago, but has plateaued for some time now despite ongoing efforts to lose weight.  She wants to lose weight for pain relief from osteoarthritis and to prevent diabetes. She will be having hysterectomy surgery soon due to extended heavy menstrual bleeding. This bleeding has also led to iron deficiency, for which she is taking supplements.   Physical activity: 30-60 minutes walking, water aerobics classes 5 days weekly  Dietary Intake:  Usual eating pattern includes 3 meals and 2 snacks per day. Dining out frequency: 6-8 meals per week.  Breakfast: up at 4am; by 5am instant oatmeal with dates and cranberries Snack: almonds unsalted, sm handful Lunch: fast food [employer] or [employer], or [employer] store; was preparing quinoa or brown rice with tuna or black beans Snack: none Supper: [employer]; whole grain and beans Snack: popcorn Beverages: water, no sodas or juices; tea or coffee very rarely  Nutrition Care Education: Topics covered: weight control  Basic nutrition: basic food groups, appropriate nutrient balance, appropriate meal and snack schedule, general nutrition guidelines    Weight control: benefits of weight control, factors affecting weight loss and metabolism; calculated energy needs for weight loss at about 1500kcal and provided guidance for basic meal planning; discussed healthy nutrient balance, patient would  like to aim for 45% CHO, 25% protein, and 35%fat. Discussed importance of low fat and low sugar choices; portion control. Instructed on the role of exercise in weight management. Discussed benefits of tracking food intake with phone app.-- patient has used Lose It app.  Advanced nutrition: food label reading for carbohydrate   Nutritional Diagnosis:  [place]-3.3 Overweight/obesity As related to excess calories, PCOS.  As evidenced by BMI 38, patient report.  Intervention: Instruction as noted above.   Commended patient for healthy food choices and regular exercise.    Set goals with direction from patient.   She will check on insurance coverage before scheduling follow-up.   Education Materials given:  . Food lists/ Planning A Balanced Meal . Sample meal pattern/ menus . Goals/ instructions  Learner/ who was taught:  . Patient   Level of understanding: Marland Kitchen Verbalizes/ demonstrates competency  Demonstrated degree of understanding via:   Teach back Learning barriers: . None  Willingness to learn/ readiness for change: . Eager, change in progress  Monitoring and Evaluation:  Dietary intake, exercise, and body weight      follow up: prn

## 2017-09-09 DIAGNOSIS — D251 Intramural leiomyoma of uterus: Secondary | ICD-10-CM

## 2017-09-09 DIAGNOSIS — R102 Pelvic and perineal pain: Secondary | ICD-10-CM | POA: Insufficient documentation

## 2017-09-09 DIAGNOSIS — D250 Submucous leiomyoma of uterus: Secondary | ICD-10-CM | POA: Insufficient documentation

## 2017-09-09 DIAGNOSIS — D252 Subserosal leiomyoma of uterus: Secondary | ICD-10-CM

## 2017-09-10 ENCOUNTER — Encounter: Payer: BLUE CROSS/BLUE SHIELD | Admitting: Obstetrics and Gynecology

## 2017-09-18 DIAGNOSIS — Z90710 Acquired absence of both cervix and uterus: Secondary | ICD-10-CM | POA: Insufficient documentation

## 2017-09-27 DIAGNOSIS — Z9889 Other specified postprocedural states: Secondary | ICD-10-CM | POA: Insufficient documentation

## 2018-01-24 ENCOUNTER — Encounter: Payer: BLUE CROSS/BLUE SHIELD | Attending: Obstetrics and Gynecology | Admitting: Dietician

## 2018-01-24 ENCOUNTER — Encounter: Payer: Self-pay | Admitting: Dietician

## 2018-01-24 VITALS — Ht 65.0 in | Wt 241.0 lb

## 2018-01-24 DIAGNOSIS — Z6838 Body mass index (BMI) 38.0-38.9, adult: Secondary | ICD-10-CM | POA: Diagnosis not present

## 2018-01-24 DIAGNOSIS — E6609 Other obesity due to excess calories: Secondary | ICD-10-CM | POA: Diagnosis not present

## 2018-01-24 DIAGNOSIS — E282 Polycystic ovarian syndrome: Secondary | ICD-10-CM

## 2018-01-24 NOTE — Progress Notes (Signed)
Medical Nutrition Therapy: Visit start time: 0900  end time: 1000  Assessment:  Diagnosis: obesity, PCOS Medical history changes: s/p hysterectomy 09/18/18 Psychosocial issues/ stress concerns: reports feelings of depression, personal stress. She is reading some books to help and is planning a personal retreat.   Current weight: 241.0lbs  Height: 5'5" Medications, supplement changes: reconciled list in medical record  Progress and evaluation: Patient has experienced weight gain since having hysterectomy surgery 09/18/17. Physical activity has decreased due to recovery from her surgery, and some stressful family issues. She is having symptoms of relapse of trigger finger--pain in right hand, arm, and shoulder along with swelling. She voices frustration with weight gain particularly since she continues to follow healthy eating guidelines discussed at previous visit on 08/30/17. She became somewhat emotional during visit due to her frustrations and stress; she is very concerned about regaining weight as she had lost from 300lbs down to about 160lbs several years ago.   Physical activity: no structured activity  Dietary Intake:  Did not assess specific dietary intake at this visit, due to discussion regarding emotional and physical health since having surgery.   Nutrition Care Education: Topics covered: weight control Weight control: non-food factors affecting metabolism and weight, such as stress, hormonal changes, physical activity; discussed anti-inflammatory and mediterranean eating patterns for potential benefit with gut hormones; discussed possible benefit of probiotic foods or supplements; discussed resuming physical activity when cleared by MD.   Nutritional Diagnosis:  [place]-3.3 Overweight/obesity As related to history of excess calories, recent inactivity due to surgical recovery, hormonal changes.  As evidenced by BMI of 40, patient report.  Intervention: Discussion as noted above.   Updated  goals with direction from patient-- she will work on restoring emotional and physical health and resume exercise as able.    No new nutrition-specific goals at this time.   No follow-up scheduled; will contact patient in 4-6 weeks to check on progress.   Education Materials given:  Marland Kitchen Goals/ instructions  Learner/ who was taught:  . Patient   Level of understanding: Marland Kitchen Verbalizes/ demonstrates competency   Demonstrated degree of understanding via:   Teach back Learning barriers: . None  Willingness to learn/ readiness for change: . Eager, change in progress   Monitoring and Evaluation:  Dietary intake, exercise, and body weight      follow up: prn

## 2018-01-24 NOTE — Patient Instructions (Signed)
   Concentrate on including anti-inflammatory foods-- vegetables, fruits, fish, some healthy oils from nuts, avocado, olive oil.   Consult with OB/GYN regarding any possible lingering hormone imbalance.   Resume regular exercise as cleared by MD.

## 2018-01-31 DIAGNOSIS — Z029 Encounter for administrative examinations, unspecified: Secondary | ICD-10-CM

## 2018-02-04 ENCOUNTER — Encounter: Payer: Self-pay | Admitting: Obstetrics and Gynecology

## 2018-02-04 ENCOUNTER — Ambulatory Visit: Payer: BLUE CROSS/BLUE SHIELD | Admitting: Obstetrics and Gynecology

## 2018-02-04 VITALS — BP 119/71 | HR 86 | Ht 65.0 in | Wt 242.5 lb

## 2018-02-04 DIAGNOSIS — R635 Abnormal weight gain: Secondary | ICD-10-CM

## 2018-02-04 DIAGNOSIS — M2550 Pain in unspecified joint: Secondary | ICD-10-CM | POA: Diagnosis not present

## 2018-02-04 DIAGNOSIS — E282 Polycystic ovarian syndrome: Secondary | ICD-10-CM | POA: Diagnosis not present

## 2018-02-04 MED ORDER — NALTREXONE-BUPROPION HCL ER 8-90 MG PO TB12: 2.0000 | ORAL_TABLET | Freq: Every day | ORAL | 3 refills | Status: AC

## 2018-02-04 NOTE — Progress Notes (Signed)
Pt has pain in rt side. Pt is here to discuss hormone issues due to not being able to loss weight.

## 2018-02-04 NOTE — Patient Instructions (Signed)
Bupropion; Naltrexone extended-release tablets What is this medicine? BUPROPION; NALTREXONE (byoo PROE pee on; nal TREX one) is a combination product used to promote and maintain weight loss in obese adults or overweight adults who also have weight related medical problems. This medicine should be used with a reduced calorie diet and increased physical activity. This medicine may be used for other purposes; ask your health care provider or pharmacist if you have questions. COMMON BRAND NAME(S): CONTRAVE What should I tell my health care provider before I take this medicine? They need to know if you have any of these conditions: -an eating disorder, such as anorexia or bulimia -bipolar disorder -diabetes -depression -drug abuse or addiction -glaucoma -head injury -heart disease -high blood pressure -history of a tumor or infection of your brain or spine -history of stroke -history of irregular heartbeat -if you often drink alcohol -kidney disease -liver disease -schizophrenia -seizures -suicidal thoughts, plans, or attempt; a previous suicide attempt by you or a family member -an unusual or allergic reaction to bupropion, naltrexone, other medicines, foods, dyes, or preservatives -breast-feeding -pregnant or trying to become pregnant How should I use this medicine? Take this medicine by mouth with a glass of water. Follow the directions on the prescription label. Take this medicine in the morning and in the evenings as directed by your healthcare professional. You can take it with or without food. Do not take with high-fat meals as this may increase your risk of seizures. Do not crush, chew, or cut these tablets. Do not take your medicine more often than directed. Do not stop taking this medicine suddenly except upon the advice of your doctor. A special MedGuide will be given to you by the pharmacist with each prescription and refill. Be sure to read this information carefully each  time. Talk to your pediatrician regarding the use of this medicine in children. Special care may be needed. Overdosage: If you think you have taken too much of this medicine contact a poison control center or emergency room at once. NOTE: This medicine is only for you. Do not share this medicine with others. What if I miss a dose? If you miss a dose, skip the missed dose and take your next tablet at the regular time. Do not take double or extra doses. What may interact with this medicine? Do not take this medicine with any of the following medications: -any prescription or street opioid drug like codeine, heroin, methadone -linezolid -MAOIs like Carbex, Eldepryl, Marplan, Nardil, and Parnate -methylene blue (injected into a vein) -other medicines that contain bupropion like Zyban or Wellbutrin This medicine may also interact with the following medications: -alcohol -certain medicines for anxiety or sleep -certain medicines for blood pressure like metoprolol, propranolol -certain medicines for depression or psychotic disturbances -certain medicines for HIV or AIDS like efavirenz, lopinavir, nelfinavir, ritonavir -certain medicines for irregular heart beat like propafenone, flecainide -certain medicines for Parkinson's disease like amantadine, levodopa -certain medicines for seizures like carbamazepine, phenytoin, phenobarbital -cimetidine -clopidogrel -cyclophosphamide -digoxin -disulfiram -furazolidone -isoniazid -nicotine -orphenadrine -procarbazine -steroid medicines like prednisone or cortisone -stimulant medicines for attention disorders, weight loss, or to stay awake -tamoxifen -theophylline -thioridazine -thiotepa -ticlopidine -tramadol -warfarin This list may not describe all possible interactions. Give your health care provider a list of all the medicines, herbs, non-prescription drugs, or dietary supplements you use. Also tell them if you smoke, drink alcohol, or use  illegal drugs. Some items may interact with your medicine. What should I watch for while using this   medicine? This medicine is intended to be used in addition to a healthy diet and appropriate exercise. The best results are achieved this way. Do not increase or in any way change your dose without consulting your doctor or health care professional. Do not take this medicine with other prescription or over-the-counter weight loss products without consulting your doctor or health care professional. Your doctor should tell you to stop taking this medicine if you do not lose a certain amount of weight within the first 12 weeks of treatment. Visit your doctor or health care professional for regular checkups. Your doctor may order blood tests or other tests to see how you are doing. This medicine may affect blood sugar levels. If you have diabetes, check with your doctor or health care professional before you change your diet or the dose of your diabetic medicine. Patients and their families should watch out for new or worsening depression or thoughts of suicide. Also watch out for sudden changes in feelings such as feeling anxious, agitated, panicky, irritable, hostile, aggressive, impulsive, severely restless, overly excited and hyperactive, or not being able to sleep. If this happens, especially at the beginning of treatment or after a change in dose, call your health care professional. Avoid alcoholic drinks while taking this medicine. Drinking large amounts of alcoholic beverages, using sleeping or anxiety medicines, or quickly stopping the use of these agents while taking this medicine may increase your risk for a seizure. What side effects may I notice from receiving this medicine? Side effects that you should report to your doctor or health care professional as soon as possible: -allergic reactions like skin rash, itching or hives, swelling of the face, lips, or tongue -breathing problems -changes in  vision -confusion -elevated mood, decreased need for sleep, racing thoughts, impulsive behavior -fast or irregular heartbeat -hallucinations, loss of contact with reality -increased blood pressure -redness, blistering, peeling or loosening of the skin, including inside the mouth -seizures -signs and symptoms of liver injury like dark yellow or brown urine; general ill feeling or flu-like symptoms; light-colored stools; loss of appetite; nausea; right upper belly pain; unusually weak or tired; yellowing of the eyes or skin -suicidal thoughts or other mood changes -vomiting Side effects that usually do not require medical attention (report to your doctor or health care professional if they continue or are bothersome): -constipation -headache -loss of appetite -indigestion, stomach upset -tremors This list may not describe all possible side effects. Call your doctor for medical advice about side effects. You may report side effects to FDA at 1-800-FDA-1088. Where should I keep my medicine? Keep out of the reach of children. Store at room temperature between 15 and 30 degrees C (59 and 86 degrees F). Throw away any unused medicine after the expiration date. NOTE: This sheet is a summary. It may not cover all possible information. If you have questions about this medicine, talk to your doctor, pharmacist, or health care provider.  2018 Elsevier/Gold Standard (2016-05-18 13:42:58)  

## 2018-02-04 NOTE — Progress Notes (Signed)
    GYNECOLOGY PROGRESS NOTE  Subjective:    Patient ID: Marissa Sandoval, female    DOB: 1976/11/15, 42 y.o.   MRN: [id number]  HPI  Patient is a 42 y.o. [id number] female who presents for discussion of further management of PCOS with regards to her weight.  Patient notes that she had a hysterectomy with bilateral salpingectomy and left oophorectomy~ 6 months ago, and since that time she notes that she has gained almost 30 lbs.  Patient notes that she has always struggled with her weight, but prior to her hysterectomy she had managed to lose ~ 20 lbs. Patient is currently already seeing a nutritionist and has been on a diet plan for several months.  She is also exercising several times per week (walking).  Notes that her nutritionist recommended that she follow up with her GYN or PCP to discuss possible endocrine issues as a reason for her weight loss challenges.    The following portions of the patient's history were reviewed and updated as appropriate: allergies, current medications, past family history, past medical history, past social history, past surgical history and problem list.  Review of Systems A comprehensive review of systems was negative except for: Genitourinary: positive for right-sided intermitent pelvic pain.  Musculoskeletal: positive for arthralgias   Objective:   Blood pressure 119/71, pulse 86, height 5\' 5"  (1.651 m), weight 242 lb 8 oz (110 kg), last menstrual period 07/26/2017.  Body mass index is 40.35 kg/m. General appearance: alert and no distress Abdomen: soft, non-tender; bowel sounds normal; no masses,  no organomegaly Remainder of exam deferred.    Assessment:   Weight gain PCOS Pelvic pain (right sided) Morbid obesity Arthralgias  Plan:   - Patient desires hormone levels redrawn today. Also notes that she had an initial consultation with a weight loss management program that also offers bio-identical hormone supplementation Lyn Henri MD).  Notes she was  interested but that was very expensive. Will recheck hormone levels s/p hysterectomy as patient no longer taking hormonal treatment for her heavy menses.  Has had a normal thyroid study in the recent past. If hormonal supplementation is warranted, will supplement as necessary.  - Patient with a h/o PCOS, discussed difficulties with weight loss due to metabolic condition. Advised to continue current dietary plan, continue visits with nutrition.  Labs ordered.  Advised on increasing intensity of exercise to include cardio.  Also discussed weight loss management options with medication.  Will prescribe Contrave.  Patient will need repeat visit in 4 weeks for weight check.  If Contrave too expensive, will refer to [facility], CNM for phentermine initiation. Also discussed possibility of referral to bariatric surgeon if future weight loss attempts are not successful.  - Arthralgias.  Patient notes that she was supposed to have a workup by her PCP to rule out RA vs OA (including labs), but due to her insurance and co-pays, she has been trying to limit her physician visits. Will order labs.    A total of 25 minutes were spent face-to-face with the patient during this encounter and over half of that time dealt with counseling and coordination of care.  Rubie Maid, MD [facility]

## 2018-02-05 ENCOUNTER — Encounter: Payer: Self-pay | Admitting: Obstetrics and Gynecology

## 2018-02-05 ENCOUNTER — Telehealth: Payer: Self-pay | Admitting: Obstetrics and Gynecology

## 2018-02-05 NOTE — Telephone Encounter (Signed)
The patient called and stated that she would like for Dr. Marcelline Mates or her nurse to give her a call back due to her insurance not being able to cover the medication that was recently prescribed. The patient stated that the medication is 344.00 to fill, and would like to have a different medication sent in as discussed if this were to happen. No other information was disclosed. Please advise.

## 2018-02-06 NOTE — Telephone Encounter (Signed)
Ok.  Please let her know that we will have her scheduled with 1 of the other providers in the office to get started on Phentermine.  Please have her scheduled to see Dr. Enzo Bi in 1-2 weeks for weight loss management.

## 2018-02-07 ENCOUNTER — Other Ambulatory Visit: Payer: BLUE CROSS/BLUE SHIELD

## 2018-02-07 NOTE — Telephone Encounter (Signed)
Pt is aware.  

## 2018-02-08 LAB — SEDIMENTATION RATE: SED RATE: 32 mm/h (ref 0–32)

## 2018-02-08 LAB — CBC
HEMATOCRIT: 40.2 % (ref 34.0–46.6)
HEMOGLOBIN: 14.2 g/dL (ref 11.1–15.9)
MCH: 29.9 pg (ref 26.6–33.0)
MCHC: 35.3 g/dL (ref 31.5–35.7)
MCV: 85 fL (ref 79–97)
Platelets: 245 10*3/uL (ref 150–379)
RBC: 4.75 x10E6/uL (ref 3.77–5.28)
RDW: 14.3 % (ref 12.3–15.4)
WBC: 6.9 10*3/uL (ref 3.4–10.8)

## 2018-02-08 LAB — FOLATE: Folate: 19.7 ng/mL (ref >3.0)

## 2018-02-08 LAB — VITAMIN B12: Vitamin B-12: 476 pg/mL (ref 232–1245)

## 2018-02-08 LAB — IRON,TIBC AND FERRITIN PANEL
FERRITIN: 134 ng/mL (ref 15–150)
IRON SATURATION: 34 % (ref 15–55)
Iron: 83 ug/dL (ref 27–159)
Total Iron Binding Capacity: 242 ug/dL — ABNORMAL LOW (ref 250–450)
UIBC: 159 ug/dL (ref 131–425)

## 2018-02-08 LAB — C-REACTIVE PROTEIN: CRP: 3.7 mg/L (ref 0.0–4.9)

## 2018-02-08 LAB — TESTOSTERONE, FREE, TOTAL, SHBG
Sex Hormone Binding: 88.4 nmol/L (ref 24.6–122.0)
TESTOSTERONE FREE: 2.4 pg/mL (ref 0.0–4.2)
Testosterone: 18 ng/dL (ref 8–48)

## 2018-02-08 LAB — ESTRADIOL: Estradiol: 291.1 pg/mL

## 2018-02-08 LAB — PROGESTERONE: Progesterone: 8.5 ng/mL

## 2018-02-08 LAB — FSH/LH
FSH: 3.5 m[IU]/mL
LH: 3.5 m[IU]/mL

## 2018-02-08 LAB — CORTISOL: Cortisol: 4.8 ug/dL

## 2018-02-08 LAB — DHEA-SULFATE: DHEA-SO4: 98.7 ug/dL (ref 57.3–279.2)

## 2018-03-21 ENCOUNTER — Ambulatory Visit: Payer: Self-pay | Admitting: Nurse Practitioner

## 2018-04-12 IMAGING — US US TRANSVAGINAL NON-OB
1 series · 14 of 25 positions shown · non-contrast
Comparison: none

[Series 1: us transvaginal non-ob · 0.31mm/px · 14 of 40 slices shown]
[im 1/40]
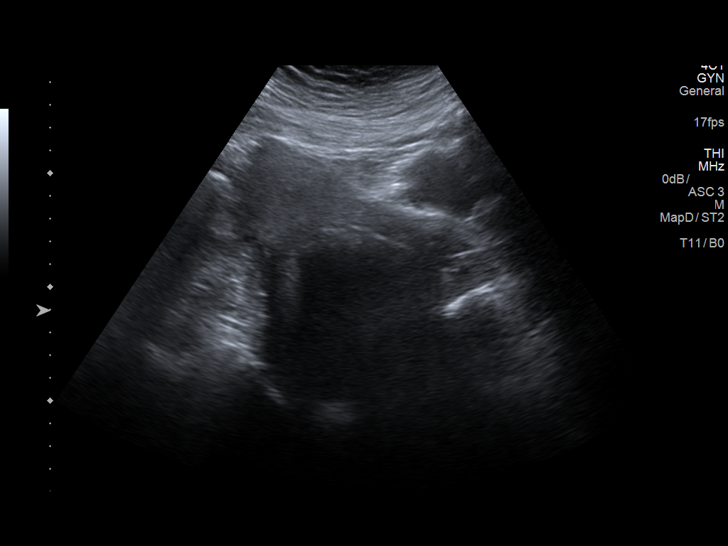
[im 4/40]
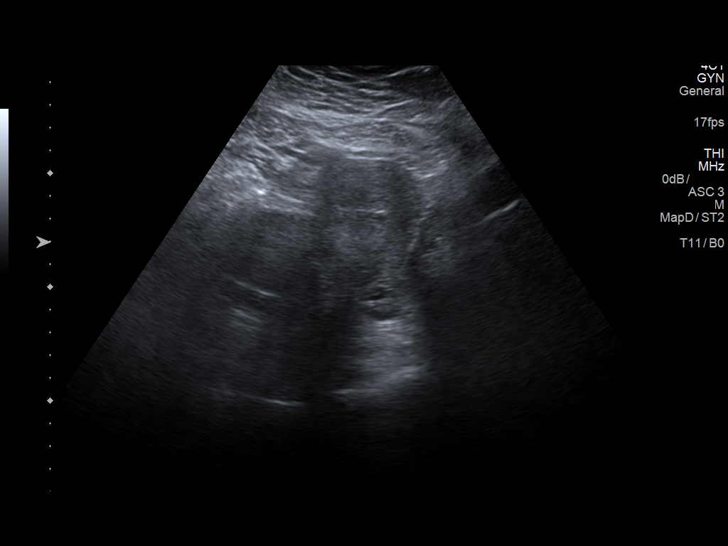
[im 7/40]
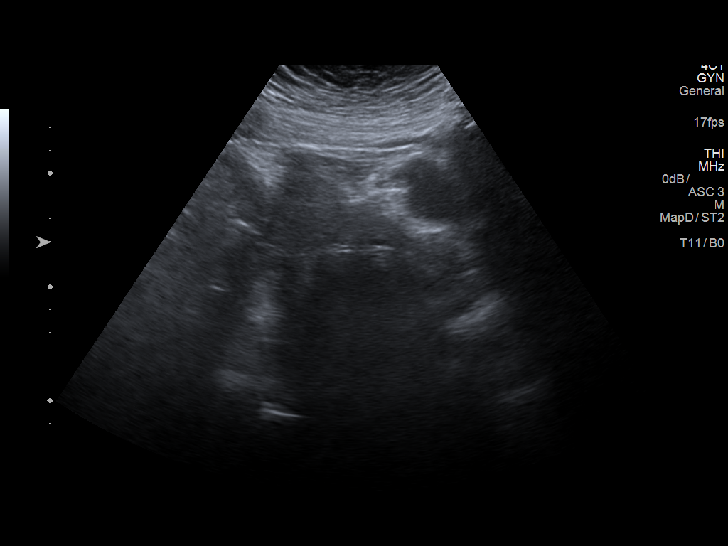
[im 10/40]
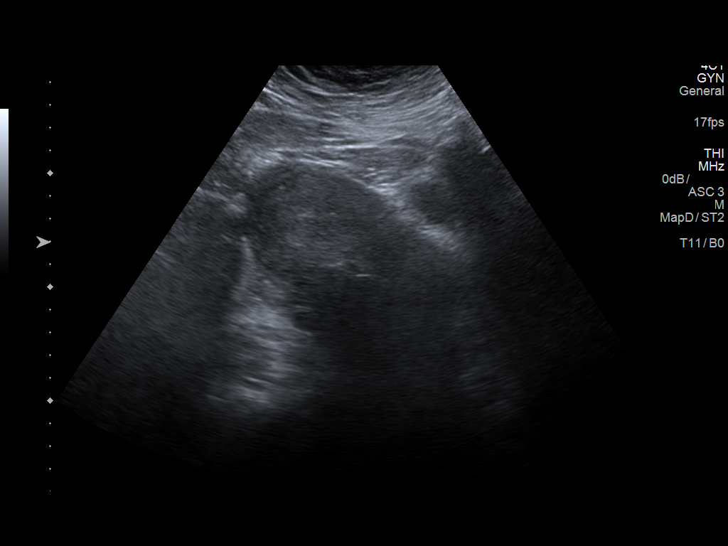
[im 14/40]
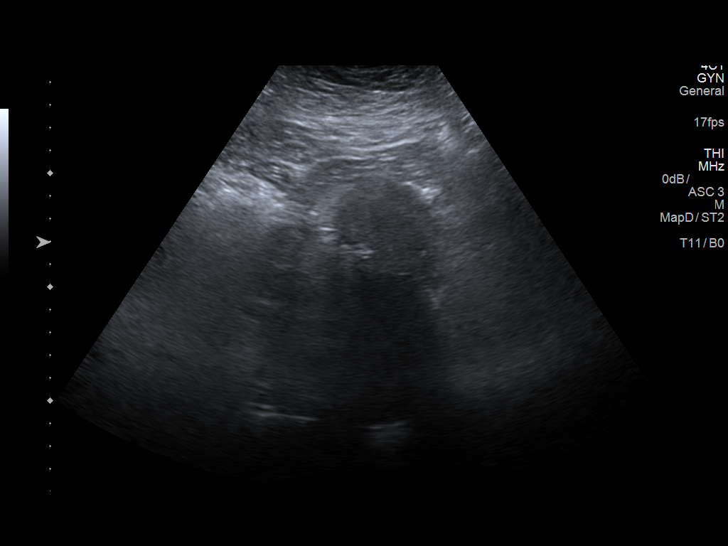
[im 15/40]
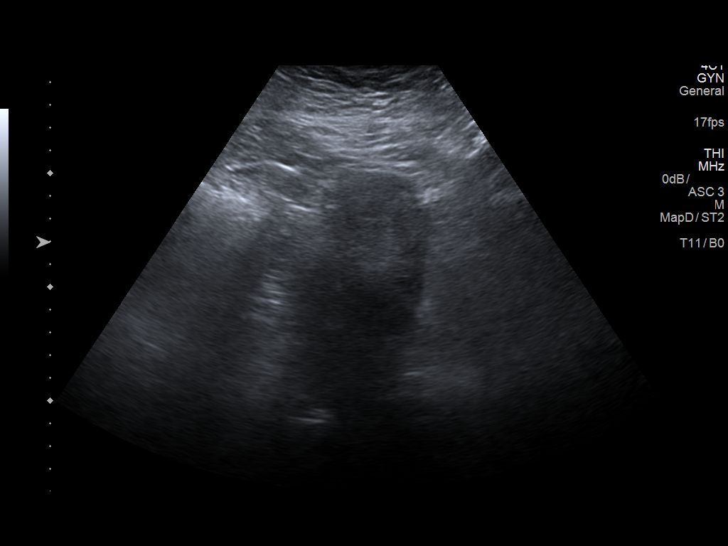
[im 18/40]
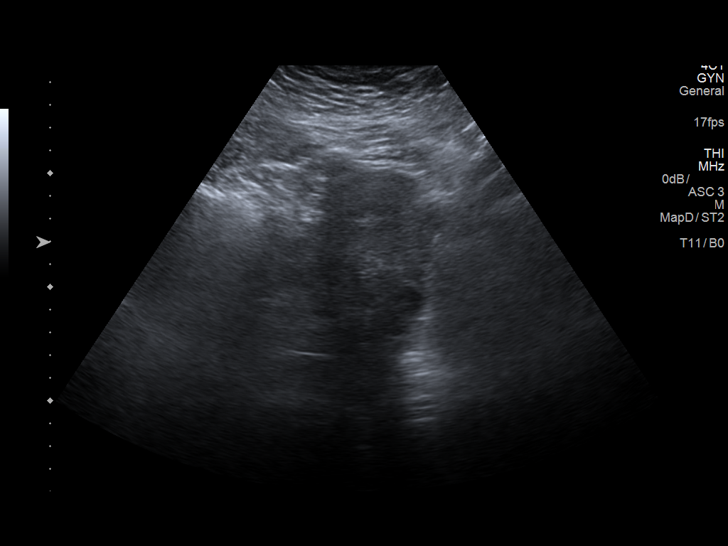
[im 22/40]
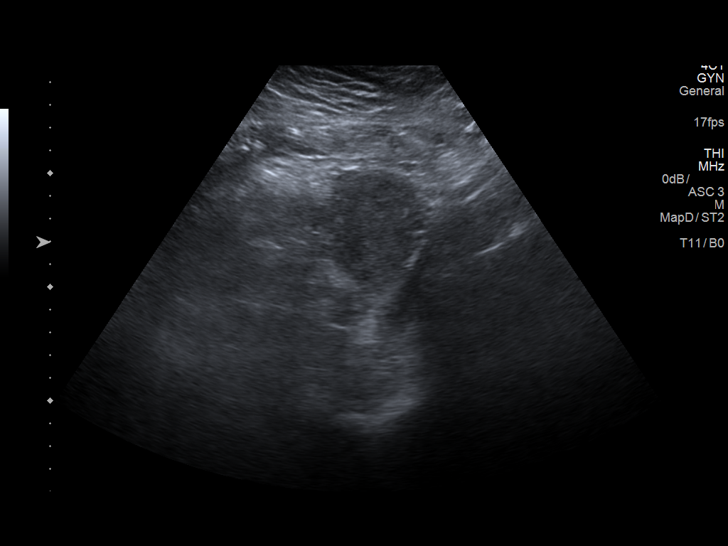
[im 25/40]
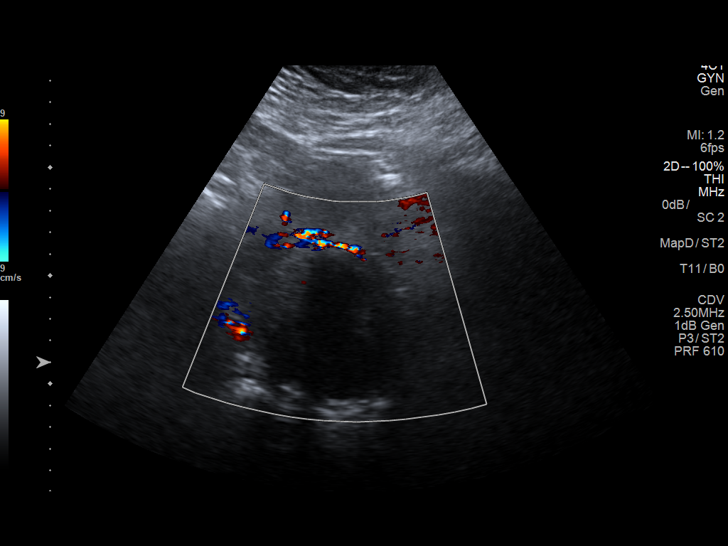
[im 27/40]
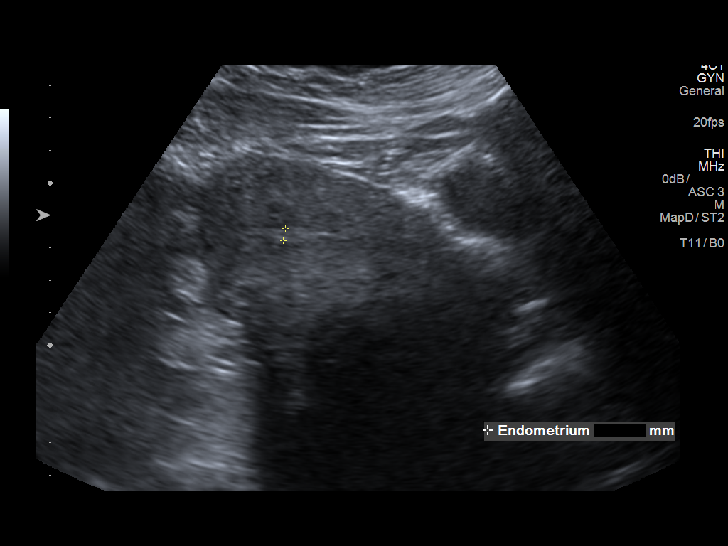
[im 30/40]
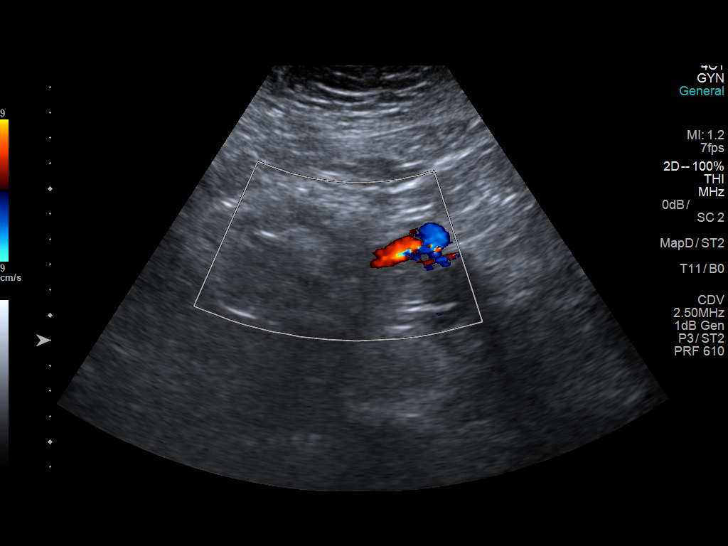
[im 33/40]
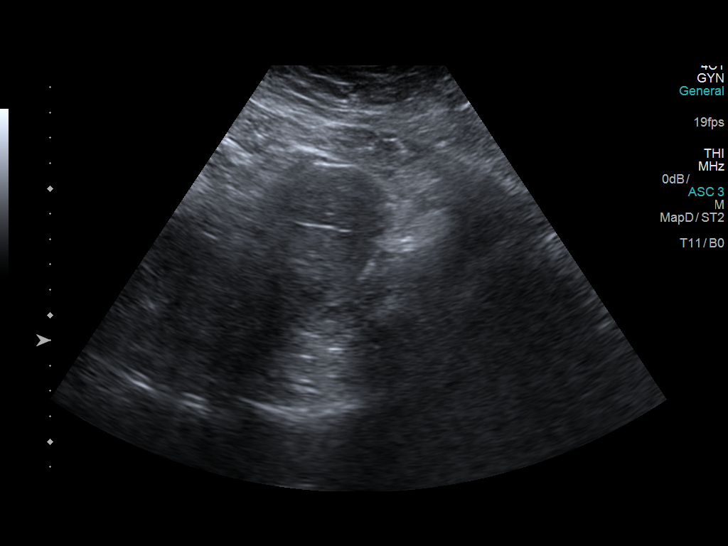
[im 36/40]
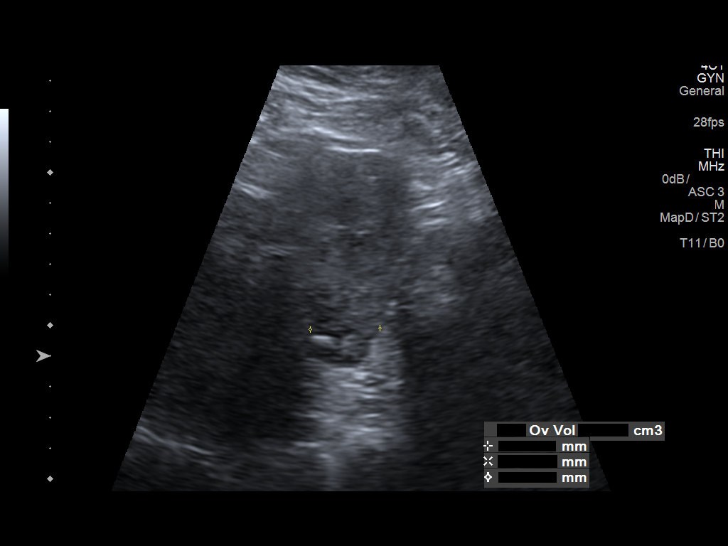
[im 40/40]
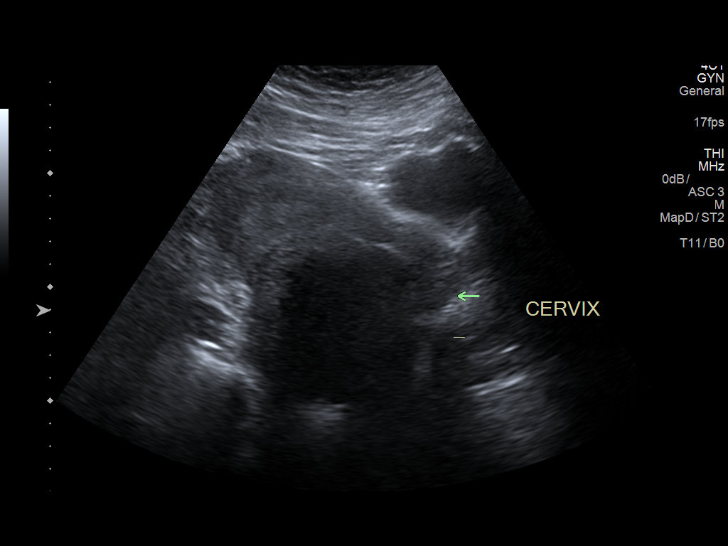

[14 of 25 positions shown; findings below may reference images not displayed]

Canned report from images found in remote index.

Refer to host system for actual result text.

## 2018-04-15 ENCOUNTER — Encounter: Payer: Self-pay | Admitting: Sports Medicine

## 2018-04-15 ENCOUNTER — Ambulatory Visit (INDEPENDENT_AMBULATORY_CARE_PROVIDER_SITE_OTHER): Payer: Non-veteran care | Admitting: Sports Medicine

## 2018-04-15 ENCOUNTER — Ambulatory Visit (INDEPENDENT_AMBULATORY_CARE_PROVIDER_SITE_OTHER): Payer: Non-veteran care

## 2018-04-15 ENCOUNTER — Telehealth: Payer: Self-pay | Admitting: *Deleted

## 2018-04-15 ENCOUNTER — Other Ambulatory Visit: Payer: Self-pay | Admitting: Sports Medicine

## 2018-04-15 VITALS — Resp 16

## 2018-04-15 DIAGNOSIS — M79671 Pain in right foot: Secondary | ICD-10-CM | POA: Diagnosis not present

## 2018-04-15 DIAGNOSIS — M2010 Hallux valgus (acquired), unspecified foot: Secondary | ICD-10-CM

## 2018-04-15 DIAGNOSIS — M722 Plantar fascial fibromatosis: Secondary | ICD-10-CM

## 2018-04-15 DIAGNOSIS — M79672 Pain in left foot: Secondary | ICD-10-CM | POA: Diagnosis not present

## 2018-04-15 MED ORDER — METHYLPREDNISOLONE 4 MG PO TBPK: ORAL_TABLET | ORAL | 0 refills | Status: AC

## 2018-04-15 NOTE — Telephone Encounter (Signed)
Marissa Sandoval - [facility] states fax rx with PT orders to [phone]. Marissa Sandoval states pt is established with them in the [place] system. Faxed orders to [facility].

## 2018-04-15 NOTE — Patient Instructions (Addendum)
Plantar Fasciitis (Heel Spur Syndrome) with Rehab The plantar fascia is a fibrous, ligament-like, soft-tissue structure that spans the bottom of the foot. Plantar fasciitis is a condition that causes pain in the foot due to inflammation of the tissue. SYMPTOMS   Pain and tenderness on the underneath side of the foot.  Pain that worsens with standing or walking. CAUSES  Plantar fasciitis is caused by irritation and injury to the plantar fascia on the underneath side of the foot. Common mechanisms of injury include:  Direct trauma to bottom of the foot.  Damage to a small nerve that runs under the foot where the main fascia attaches to the heel bone.  Stress placed on the plantar fascia due to bone spurs. RISK INCREASES WITH:   Activities that place stress on the plantar fascia (running, jumping, pivoting, or cutting).  Poor strength and flexibility.  Improperly fitted shoes.  Tight calf muscles.  Flat feet.  Failure to warm-up properly before activity.  Obesity. PREVENTION  Warm up and stretch properly before activity.  Allow for adequate recovery between workouts.  Maintain physical fitness:  Strength, flexibility, and endurance.  Cardiovascular fitness.  Maintain a health body weight.  Avoid stress on the plantar fascia.  Wear properly fitted shoes, including arch supports for individuals who have flat feet.  PROGNOSIS  If treated properly, then the symptoms of plantar fasciitis usually resolve without surgery. However, occasionally surgery is necessary.  RELATED COMPLICATIONS   Recurrent symptoms that may result in a chronic condition.  Problems of the lower back that are caused by compensating for the injury, such as limping.  Pain or weakness of the foot during push-off following surgery.  Chronic inflammation, scarring, and partial or complete fascia tear, occurring more often from repeated injections.  TREATMENT  Treatment initially involves the  use of ice and medication to help reduce pain and inflammation. The use of strengthening and stretching exercises may help reduce pain with activity, especially stretches of the Achilles tendon. These exercises may be performed at home or with a therapist. Your caregiver may recommend that you use heel cups of arch supports to help reduce stress on the plantar fascia. Occasionally, corticosteroid injections are given to reduce inflammation. If symptoms persist for greater than 6 months despite non-surgical (conservative), then surgery may be recommended.   MEDICATION   If pain medication is necessary, then nonsteroidal anti-inflammatory medications, such as aspirin and ibuprofen, or other minor pain relievers, such as acetaminophen, are often recommended.  Do not take pain medication within 7 days before surgery.  Prescription pain relievers may be given if deemed necessary by your caregiver. Use only as directed and only as much as you need.  Corticosteroid injections may be given by your caregiver. These injections should be reserved for the most serious cases, because they may only be given a certain number of times.  HEAT AND COLD  Cold treatment (icing) relieves pain and reduces inflammation. Cold treatment should be applied for 10 to 15 minutes every 2 to 3 hours for inflammation and pain and immediately after any activity that aggravates your symptoms. Use ice packs or massage the area with a piece of ice (ice massage).  Heat treatment may be used prior to performing the stretching and strengthening activities prescribed by your caregiver, physical therapist, or athletic trainer. Use a heat pack or soak the injury in warm water.  SEEK IMMEDIATE MEDICAL CARE IF:  Treatment seems to offer no benefit, or the condition worsens.  Any medications   produce adverse side effects.  EXERCISES- RANGE OF MOTION (ROM) AND STRETCHING EXERCISES - Plantar Fasciitis (Heel Spur Syndrome) These exercises  may help you when beginning to rehabilitate your injury. Your symptoms may resolve with or without further involvement from your physician, physical therapist or athletic trainer. While completing these exercises, remember:   Restoring tissue flexibility helps normal motion to return to the joints. This allows healthier, less painful movement and activity.  An effective stretch should be held for at least 30 seconds.  A stretch should never be painful. You should only feel a gentle lengthening or release in the stretched tissue.  RANGE OF MOTION - Toe Extension, Flexion  Sit with your right / left leg crossed over your opposite knee.  Grasp your toes and gently pull them back toward the top of your foot. You should feel a stretch on the bottom of your toes and/or foot.  Hold this stretch for 10 seconds.  Now, gently pull your toes toward the bottom of your foot. You should feel a stretch on the top of your toes and or foot.  Hold this stretch for 10 seconds. Repeat  times. Complete this stretch 3 times per day.   RANGE OF MOTION - Ankle Dorsiflexion, Active Assisted  Remove shoes and sit on a chair that is preferably not on a carpeted surface.  Place right / left foot under knee. Extend your opposite leg for support.  Keeping your heel down, slide your right / left foot back toward the chair until you feel a stretch at your ankle or calf. If you do not feel a stretch, slide your bottom forward to the edge of the chair, while still keeping your heel down.  Hold this stretch for 10 seconds. Repeat 3 times. Complete this stretch 2 times per day.   STRETCH  Gastroc, Standing  Place hands on wall.  Extend right / left leg, keeping the front knee somewhat bent.  Slightly point your toes inward on your back foot.  Keeping your right / left heel on the floor and your knee straight, shift your weight toward the wall, not allowing your back to arch.  You should feel a gentle stretch  in the right / left calf. Hold this position for 10 seconds. Repeat 3 times. Complete this stretch 2 times per day.  STRETCH  Soleus, Standing  Place hands on wall.  Extend right / left leg, keeping the other knee somewhat bent.  Slightly point your toes inward on your back foot.  Keep your right / left heel on the floor, bend your back knee, and slightly shift your weight over the back leg so that you feel a gentle stretch deep in your back calf.  Hold this position for 10 seconds. Repeat 3 times. Complete this stretch 2 times per day.  STRETCH  Gastrocsoleus, Standing  Note: This exercise can place a lot of stress on your foot and ankle. Please complete this exercise only if specifically instructed by your caregiver.   Place the ball of your right / left foot on a step, keeping your other foot firmly on the same step.  Hold on to the wall or a rail for balance.  Slowly lift your other foot, allowing your body weight to press your heel down over the edge of the step.  You should feel a stretch in your right / left calf.  Hold this position for 10 seconds.  Repeat this exercise with a slight bend in your right /   left knee. Repeat 3 times. Complete this stretch 2 times per day.   STRENGTHENING EXERCISES - Plantar Fasciitis (Heel Spur Syndrome)  These exercises may help you when beginning to rehabilitate your injury. They may resolve your symptoms with or without further involvement from your physician, physical therapist or athletic trainer. While completing these exercises, remember:   Muscles can gain both the endurance and the strength needed for everyday activities through controlled exercises.  Complete these exercises as instructed by your physician, physical therapist or athletic trainer. Progress the resistance and repetitions only as guided.  STRENGTH - Towel Curls  Sit in a chair positioned on a non-carpeted surface.  Place your foot on a towel, keeping your heel  on the floor.  Pull the towel toward your heel by only curling your toes. Keep your heel on the floor. Repeat 3 times. Complete this exercise 2 times per day.  STRENGTH - Ankle Inversion  Secure one end of a rubber exercise band/tubing to a fixed object (table, pole). Loop the other end around your foot just before your toes.  Place your fists between your knees. This will focus your strengthening at your ankle.  Slowly, pull your big toe up and in, making sure the band/tubing is positioned to resist the entire motion.  Hold this position for 10 seconds.  Have your muscles resist the band/tubing as it slowly pulls your foot back to the starting position. Repeat 3 times. Complete this exercises 2 times per day.  Document Released: 11/26/2005 Document Revised: 02/18/2012 Document Reviewed: 03/10/2009 Exit[employer] Patient Information 2014 [employer]. Bunion A bunion is a bump on the base of the big toe that forms when the bones of the big toe joint move out of position. Bunions may be small at first, but they often get larger over time. The can make walking painful. What are the causes? A bunion may be caused by:  Wearing narrow or pointed shoes that force the big toe to press against the other toes.  Abnormal foot development that causes the foot to roll inward (pronate).  Changes in the foot that are caused by certain diseases, such as rheumatoid arthritis and polio.  A foot injury.  What increases the risk? The following factors may make you more likely to develop this condition:  Wearing shoes that squeeze the toes together.  Having certain diseases, such as: ? Rheumatoid arthritis. ? Polio. ? Cerebral palsy.  Having family members who have bunions.  Being born with a foot deformity, such as flat feet or low arches.  Doing activities that put a lot of pressure on the feet, such as ballet dancing.  What are the signs or symptoms? The main symptom of a bunion is a  noticeable bump on the big toe. Other symptoms may include:  Pain.  Swelling around the big toe.  Redness and inflammation.  Thick or hardened skin on the big toe or between the toes.  Stiffness or loss of motion in the big toe.  Trouble with walking.  How is this diagnosed? A bunion may be diagnosed based on your symptoms, medical history, and activities. You may have tests, such as:  X-rays. These allow your health care provider to check the position of the bones in your foot and look for damage to your joint. They also help your health care provider to determine the severity of your bunion and the best way to treat it.  Joint aspiration. In this test, a sample of fluid is removed from   the toe joint. This test, which may be done if you are in a lot of pain, helps to rule out diseases that cause painful swelling of the joints, such as arthritis.  How is this treated? There is no cure for a bunion, but treatment can help to prevent a bunion from getting worse. Treatment depends on the severity of your symptoms. Your health care provider may recommend:  Wearing shoes that have a wide toe box.  Using bunion pads to cushion the affected area.  Taping your toes together to keep them in a normal position.  Placing a device inside your shoe (orthotics) to help reduce pressure on your toe joint.  Taking medicine to ease pain, inflammation, and swelling.  Applying heat or ice to the affected area.  Doing stretching exercises.  Surgery to remove scar tissue and move the toes back into their normal position. This treatment is rare.  Follow these instructions at home:  Support your toe joint with proper footwear, shoe padding, or taping as told by your health care provider.  Take over-the-counter and prescription medicines only as told by your health care provider.  If directed, apply ice to the injured area: ? Put ice in a plastic bag. ? Place a towel between your skin and the  bag. ? Leave the ice on for 20 minutes, 2-3 times per day.  If directed, apply heat to the affected area before you exercise. Use the heat source that your health care provider recommends, such as a moist heat pack or a heating pad. ? Place a towel between your skin and the heat source. ? Leave the heat on for 20-30 minutes. ? Remove the heat if your skin turns bright red. This is especially important if you are unable to feel pain, heat, or cold. You may have a greater risk of getting burned.  Do exercises as told by your health care provider.  Keep all follow-up visits as told by your health care provider. Contact a health care provider if:  Your symptoms get worse.  Your symptoms do not improve in 2 weeks. Get help right away if:  You have severe pain and trouble with walking. This information is not intended to replace advice given to you by your health care provider. Make sure you discuss any questions you have with your health care provider. Document Released: 11/26/2005 Document Revised: 05/03/2016 Document Reviewed: 06/26/2015 Elsevier Interactive Patient Education  2018 [employer].  

## 2018-04-15 NOTE — Telephone Encounter (Signed)
-----   Message from Landis Martins, [place] sent at 04/15/2018  8:51 AM EDT ----- Regarding: PT for fasciitis  Send orders for [facility] to [facility] in [place] [facility] Dx Plantar fasciitis bil Thanks Dr. Cannon Kettle

## 2018-04-15 NOTE — Progress Notes (Signed)
Subjective: Marissa Sandoval is a 42 y.o. female patient presents to office with complaint of moderate heel pain on the left/right. Patient admits to post static dyskinesia for many years in duration. Patient has treated this problem with stretching and night splint in the past give by Dr. Valentina Lucks with no relief.  Admits to pain over bunion that gets worse on right by end of day. Tried foot massager and stretching sometimes. Reports that she takes motrin and gabapentin for neck and back with no additional relief. Denies any other pedal complaints.   Review of Systems  Musculoskeletal: Positive for joint pain and neck pain.  All other systems reviewed and are negative.   Patient Active Problem List   Diagnosis Date Noted  . Post-operative state 09/27/2017  . H/O abdominal hysterectomy 09/18/2017  . Intramural, submucous, and subserous leiomyoma of uterus 09/09/2017  . Pelvic pain in female 09/09/2017  . Menorrhagia 07/15/2015  . Plantar fasciitis 08/14/2013  . Foot pain 08/14/2013  . Forearm pain 07/11/2012  . Gonalgia 07/11/2012  . Lumbar radiculopathy 07/11/2012    Current Outpatient Medications on File Prior to Visit  Medication Sig Dispense Refill  . B Complex Vitamins (B COMPLEX PO) Take 1 capsule by mouth daily.    . Cetirizine HCl 10 MG CAPS Take by mouth.    . diclofenac sodium (VOLTAREN) 1 % GEL Apply 2 g topically 4 (four) times daily.    Marland Kitchen EPINEPHrine 0.3 mg/0.3 mL IJ SOAJ injection INJECT INTRAMUSCULARLY AS DIRECTED 1 Device 99  . Ferrous Gluconate-C-Folic Acid (IRON-C PO) Take by mouth.    . gabapentin (NEURONTIN) 100 MG capsule Take 1 capsule (100 mg total) by mouth at bedtime. 30 capsule 11  . GLUCOSAMINE HCL PO Take by mouth daily.    Marland Kitchen ketoconazole (NIZORAL) 2 % shampoo Apply topically.    Marland Kitchen MAGNESIUM PO Take by mouth.    . Menthol-Methyl Salicylate ([place] EX) Apply topically.    . metFORMIN (GLUCOPHAGE) 500 MG tablet TK 1 T PO D  5  . Multiple Vitamins-Minerals  (MULTIVITAMIN ADULT PO) Take 1 capsule by mouth daily.    . Naltrexone-buPROPion HCl ER (CONTRAVE) 8-90 MG TB12 Take 2 tablets by mouth daily. Week 1: 1 tab in a.m.  Week 2: 1 tab BID. Week 3: 2 tab in a.m., 1 tab in p.m. Week 4: 2 tablets BID. 120 tablet 3  . PROAIR HFA 108 (90 Base) MCG/ACT inhaler INHALE 2 PUFFS Q 4-6 H PRN  0  . sertraline (ZOLOFT) 25 MG tablet Take 1 tablet (25 mg total) by mouth daily. 30 tablet 11  . spironolactone (ALDACTONE) 100 MG tablet Take 1 tablet (100 mg total) by mouth daily. 30 tablet 11  . tretinoin (RETIN-A) 0.05 % cream Apply topically at bedtime.    . triamcinolone cream (KENALOG) 0.1 %   0   No current facility-administered medications on file prior to visit.     Allergies  Allergen Reactions  . Latex Itching and Rash  . Penicillins Nausea And Vomiting    Objective: Physical Exam General: The patient is alert and oriented x3 in no acute distress.  Dermatology: Skin is warm, dry and supple bilateral lower extremities. Nails 1-10 are normal. There is no erythema, edema, no eccymosis, no open lesions present. Integument is otherwise unremarkable.  Vascular: Dorsalis Pedis pulse and Posterior Tibial pulse are 2/4 bilateral. Capillary fill time is immediate to all digits.  Neurological: Grossly intact to light touch with an achilles reflex of +2/5  and a  negative Tinel's sign bilateral.  Musculoskeletal: Tenderness to palpation at the medial calcaneal tubercale and through the insertion of the plantar fascia on the left and right foot. No pain with compression of calcaneus bilateral. No pain with tuning fork to calcaneus bilateral. No pain with calf compression bilateral. There is decreased Ankle joint range of motion bilateral. + Pes planus and bunion on right, All other joints range of motion within normal limits bilateral. Strength 5/5 in all groups bilateral.   Gait: Unassisted  Xray, Right/Left foot:  Normal osseous mineralization. Joint spaces  preserved except midtarsal where there is breach supportive of pes planus and bunion on right. No fracture/dislocation/boney destruction. Calcaneal spur present with mild thickening of plantar fascia. No other soft tissue abnormalities or radiopaque foreign bodies.   Assessment and Plan: Problem List Items Addressed This Visit    None    Visit Diagnoses    Valgus deformity of great toe, unspecified laterality    -  Primary   Right   Relevant Orders   DG Foot Complete Left (Completed)   Plantar fasciitis, bilateral       Foot pain, bilateral         -Complete examination performed.  -Xrays reviewed -Discussed with patient in detail the condition of plantar fasciitis and bunion, how this occurs and general treatment options. Explained both conservative and surgical treatments.  -Patient declined injection.  -Rx Medrol dose pack -Recommended good supportive shoes and advised use of OTC insert. Explained to patient that if these orthoses work well, we will continue with these. If these do not improve her condition and  pain, we will consider custom molded orthoses. -Explained and dispensed to patient daily stretching exercises. -Recommend patient to ice affected area 1-2x daily. -Rx PT at [facility] in [facility] for plantar fasciitis bilateral  -Dispensed silicone bunion shield for right  -Patient to return to office in 4-6 weeks for follow up or sooner if problems or questions arise.  Landis Martins, DPM

## 2018-04-17 ENCOUNTER — Encounter: Payer: Self-pay | Admitting: Sports Medicine

## 2018-04-17 NOTE — Telephone Encounter (Signed)
Faxed request for [facility] to [facility] and emailed pt that the pre-cert process had been started.

## 2018-05-27 ENCOUNTER — Encounter: Payer: Self-pay | Admitting: Sports Medicine

## 2018-05-27 ENCOUNTER — Ambulatory Visit (INDEPENDENT_AMBULATORY_CARE_PROVIDER_SITE_OTHER): Payer: BLUE CROSS/BLUE SHIELD | Admitting: Sports Medicine

## 2018-05-27 DIAGNOSIS — B351 Tinea unguium: Secondary | ICD-10-CM

## 2018-05-27 DIAGNOSIS — M2010 Hallux valgus (acquired), unspecified foot: Secondary | ICD-10-CM

## 2018-05-27 DIAGNOSIS — M79672 Pain in left foot: Secondary | ICD-10-CM

## 2018-05-27 DIAGNOSIS — B359 Dermatophytosis, unspecified: Secondary | ICD-10-CM

## 2018-05-27 DIAGNOSIS — M722 Plantar fascial fibromatosis: Secondary | ICD-10-CM

## 2018-05-27 DIAGNOSIS — M79671 Pain in right foot: Secondary | ICD-10-CM

## 2018-05-27 MED ORDER — CLOTRIMAZOLE 1 % EX SOLN: CUTANEOUS | 5 refills | Status: DC

## 2018-05-27 NOTE — Progress Notes (Signed)
Subjective: Petina M Shepperson is a 42 y.o. female returns to office for follow up evaluation of bilateral heel pain and right bunion. Patient reports that pain is better has been stretching, and got Orthotics today. Patient has not started PT yet. Patient also is concerned about scaling skin in between toes and thickness to nails, tried Ketoconazole and tolnafate powder given by [facility] with no improvement. Patient reports that it itches and blisters in between toes. Patient denies any recent changes in medications or any other issues.    Patient Active Problem List   Diagnosis Date Noted  . Post-operative state 09/27/2017  . H/O abdominal hysterectomy 09/18/2017  . Intramural, submucous, and subserous leiomyoma of uterus 09/09/2017  . Pelvic pain in female 09/09/2017  . Menorrhagia 07/15/2015  . Plantar fasciitis 08/14/2013  . Foot pain 08/14/2013  . Forearm pain 07/11/2012  . Gonalgia 07/11/2012  . Lumbar radiculopathy 07/11/2012    Current Outpatient Medications on File Prior to Visit  Medication Sig Dispense Refill  . B Complex Vitamins (B COMPLEX PO) Take 1 capsule by mouth daily.    . Cetirizine HCl 10 MG CAPS Take by mouth.    . diclofenac sodium (VOLTAREN) 1 % GEL Apply 2 g topically 4 (four) times daily.    Marland Kitchen EPINEPHrine 0.3 mg/0.3 mL IJ SOAJ injection INJECT INTRAMUSCULARLY AS DIRECTED 1 Device 99  . Ferrous Gluconate-C-Folic Acid (IRON-C PO) Take by mouth.    . gabapentin (NEURONTIN) 100 MG capsule Take 1 capsule (100 mg total) by mouth at bedtime. 30 capsule 11  . GLUCOSAMINE HCL PO Take by mouth daily.    Marland Kitchen ketoconazole (NIZORAL) 2 % shampoo Apply topically.    Marland Kitchen MAGNESIUM PO Take by mouth.    . Menthol-Methyl Salicylate ([place] EX) Apply topically.    . metFORMIN (GLUCOPHAGE) 500 MG tablet TK 1 T PO D  5  . methylPREDNISolone (MEDROL DOSEPAK) 4 MG TBPK tablet Take as directed 21 tablet 0  . Multiple Vitamins-Minerals (MULTIVITAMIN ADULT PO) Take 1 capsule by mouth daily.     . Naltrexone-buPROPion HCl ER (CONTRAVE) 8-90 MG TB12 Take 2 tablets by mouth daily. Week 1: 1 tab in a.m.  Week 2: 1 tab BID. Week 3: 2 tab in a.m., 1 tab in p.m. Week 4: 2 tablets BID. 120 tablet 3  . PROAIR HFA 108 (90 Base) MCG/ACT inhaler INHALE 2 PUFFS Q 4-6 H PRN  0  . sertraline (ZOLOFT) 25 MG tablet Take 1 tablet (25 mg total) by mouth daily. 30 tablet 11  . spironolactone (ALDACTONE) 100 MG tablet Take 1 tablet (100 mg total) by mouth daily. 30 tablet 11  . tretinoin (RETIN-A) 0.05 % cream Apply topically at bedtime.    . triamcinolone cream (KENALOG) 0.1 %   0   No current facility-administered medications on file prior to visit.     Allergies  Allergen Reactions  . Latex Itching and Rash  . Penicillins Nausea And Vomiting    Objective:   General:  Alert and oriented x 3, in no acute distress  Dermatology: Skin is warm, dry and supple bilateral lower extremities. Nails 1-10 are normal excess thickness R 4-5 and L 5 toenails and scaly skin in between toes. There is no erythema, edema, no eccymosis, no open lesions present. Integument is otherwise unremarkable.  Vascular: Dorsalis Pedis pulse and Posterior Tibial pulse are 2/4 bilateral. Capillary fill time is immediate to all digits.  Neurological: Grossly intact to light touch with an achilles reflex of +  2/5 and a  negative Tinel's sign bilateral.  Musculoskeletal: Decreased tenderness to palpation at the medial calcaneal tubercale and through the insertion of the plantar fascia on the left and right foot. No pain with compression of calcaneus bilateral. No pain with tuning fork to calcaneus bilateral. No pain with calf compression bilateral. There is decreased Ankle joint range of motion bilateral. + Pes planus and bunion on right, All other joints range of motion within normal limits bilateral. Strength 5/5 in all groups bilateral.   Assessment and Plan: Problem List Items Addressed This Visit    None    Visit  Diagnoses    Plantar fasciitis, bilateral    -  Primary   Valgus deformity of great toe, unspecified laterality       Foot pain, bilateral         -Complete examination performed.  -Previous x-rays reviewed. -Re-Discussed with patient in detail the condition of plantar fasciitis, how this  occurs related to the foot type of the patient and general treatment options. -Awaiting PT at [facility] in [place]; Val to follow up this request  -Continue with stretching, icing, good supportive shoes, inserts daily.  -Discussed long term care and reocurrence; will closely monitor; if fails to improve will consider other treatment modalities.  -Rx Clotrimazole; Val will fax Rx to [place] -Fungal culture obtained from nails 5 bilateral and right 4th toenail from hard nail plate and sent to [facility] -Patient to return to office 1 month fungal culture results or sooner if problems or questions arise.  Landis Martins, DPM

## 2018-06-04 ENCOUNTER — Telehealth: Payer: Self-pay | Admitting: *Deleted

## 2018-06-04 DIAGNOSIS — M722 Plantar fascial fibromatosis: Secondary | ICD-10-CM

## 2018-06-04 NOTE — Telephone Encounter (Signed)
Left message for pt to call with the [place] she uses and [facility] information for PT.

## 2018-06-05 NOTE — Telephone Encounter (Signed)
Faxed Lotrimin rx to [facility] - [phone], and [employer].

## 2018-06-05 NOTE — Telephone Encounter (Signed)
Pt request rx Lotrimin be sent to [facility] [place] on [address] [phone], and fax to [facility] [phone], and Alen Bleacher for PT referral [phone].

## 2018-06-10 NOTE — Telephone Encounter (Signed)
[facility] representative states outside referrals need [facility] Referral form completed and faxed to number on the form.

## 2018-06-10 NOTE — Addendum Note (Signed)
Addended by: Harriett Sine D on: 06/10/2018 09:47 AM   Modules accepted: Orders

## 2018-06-18 NOTE — Telephone Encounter (Signed)
Pt states she was checking the status of the PT referral. I told pt I had received a notice from the [facility] stating pt was not a [place] pt. I told pt I had gotten the form from the [place] states she would get the Non-VA Referral form for the [facility].

## 2018-06-23 NOTE — Telephone Encounter (Signed)
Pt faxed proper form for Non-VA Referrals - Secondary Authorization Request (SAR) Form on 06/20/2108.

## 2018-06-24 ENCOUNTER — Encounter: Payer: Self-pay | Admitting: Sports Medicine

## 2018-06-24 ENCOUNTER — Ambulatory Visit (INDEPENDENT_AMBULATORY_CARE_PROVIDER_SITE_OTHER): Payer: BLUE CROSS/BLUE SHIELD | Admitting: Sports Medicine

## 2018-06-24 DIAGNOSIS — M79671 Pain in right foot: Secondary | ICD-10-CM | POA: Diagnosis not present

## 2018-06-24 DIAGNOSIS — M79672 Pain in left foot: Secondary | ICD-10-CM

## 2018-06-24 DIAGNOSIS — M722 Plantar fascial fibromatosis: Secondary | ICD-10-CM | POA: Diagnosis not present

## 2018-06-24 DIAGNOSIS — B351 Tinea unguium: Secondary | ICD-10-CM

## 2018-06-24 DIAGNOSIS — B359 Dermatophytosis, unspecified: Secondary | ICD-10-CM

## 2018-06-24 NOTE — Progress Notes (Signed)
Subjective: Marissa Sandoval is a 42 y.o. female returns to office for follow up evaluation of bilateral heel pain and right bunion and nail/skin changes. Patient reports that pain is better, still waiting on starting PT and states that she feels like her nail and skin is clearing up with topical solution. Denies swelling, warmth, redness, nausea, vomiting, fever, chills since using Clotrimazole. Patient denies any recent changes in medications or any other issues.    Patient Active Problem List   Diagnosis Date Noted  . Post-operative state 09/27/2017  . H/O abdominal hysterectomy 09/18/2017  . Intramural, submucous, and subserous leiomyoma of uterus 09/09/2017  . Pelvic pain in female 09/09/2017  . Menorrhagia 07/15/2015  . Plantar fasciitis 08/14/2013  . Foot pain 08/14/2013  . Forearm pain 07/11/2012  . Gonalgia 07/11/2012  . Lumbar radiculopathy 07/11/2012    Current Outpatient Medications on File Prior to Visit  Medication Sig Dispense Refill  . albuterol (PROVENTIL) (2.5 MG/3ML) 0.083% nebulizer solution Inhale into the lungs.    . B Complex Vitamins (B COMPLEX PO) Take 1 capsule by mouth daily.    . Cetirizine HCl 10 MG CAPS Take by mouth.    . clotrimazole (LOTRIMIN) 1 % external solution Daily at bedtime In between toes 60 mL 5  . diclofenac sodium (VOLTAREN) 1 % GEL Apply 2 g topically 4 (four) times daily.    Marland Kitchen EPINEPHrine 0.3 mg/0.3 mL IJ SOAJ injection INJECT INTRAMUSCULARLY AS DIRECTED 1 Device 99  . Ferrous Gluconate-C-Folic Acid (IRON-C PO) Take by mouth.    . gabapentin (NEURONTIN) 100 MG capsule Take 1 capsule (100 mg total) by mouth at bedtime. 30 capsule 11  . GLUCOSAMINE HCL PO Take by mouth daily.    Marland Kitchen ketoconazole (NIZORAL) 2 % shampoo Apply topically.    Marland Kitchen MAGNESIUM PO Take by mouth.    . Menthol-Methyl Salicylate ([place] EX) Apply topically.    . metFORMIN (GLUCOPHAGE) 500 MG tablet TK 1 T PO D  5  . methylPREDNISolone (MEDROL DOSEPAK) 4 MG TBPK tablet  Take as directed 21 tablet 0  . Multiple Vitamins-Minerals (MULTIVITAMIN ADULT PO) Take 1 capsule by mouth daily.    . Naltrexone-buPROPion HCl ER (CONTRAVE) 8-90 MG TB12 Take 2 tablets by mouth daily. Week 1: 1 tab in a.m.  Week 2: 1 tab BID. Week 3: 2 tab in a.m., 1 tab in p.m. Week 4: 2 tablets BID. 120 tablet 3  . PROAIR HFA 108 (90 Base) MCG/ACT inhaler INHALE 2 PUFFS Q 4-6 H PRN  0  . sertraline (ZOLOFT) 25 MG tablet Take 1 tablet (25 mg total) by mouth daily. 30 tablet 11  . Spacer/Aero-Holding Chambers (AEROCHAMBER MV) inhaler by Does not apply route.    Marland Kitchen spironolactone (ALDACTONE) 100 MG tablet Take 1 tablet (100 mg total) by mouth daily. 30 tablet 11  . SYMBICORT 160-4.5 MCG/ACT inhaler INHALE 2 PUFFS PO BID  11  . tretinoin (RETIN-A) 0.05 % cream Apply topically at bedtime.    . triamcinolone cream (KENALOG) 0.1 %   0   No current facility-administered medications on file prior to visit.     Allergies  Allergen Reactions  . Latex Itching and Rash  . Penicillins Nausea And Vomiting    Objective:   General:  Alert and oriented x 3, in no acute distress  Dermatology: Skin is warm, dry and supple bilateral lower extremities. Nails 1-10 are normal excess thickness R 4-5 and L 5 toenails and scaly skin in between toes that  is improving. There is no erythema, edema, no eccymosis, no open lesions present. Integument is otherwise unremarkable.  Vascular: Dorsalis Pedis pulse and Posterior Tibial pulse are 2/4 bilateral. Capillary fill time is immediate to all digits.  Neurological: Grossly intact to light touch with an achilles reflex of +2/5 and a  negative Tinel's sign bilateral.  Musculoskeletal: Decreased tenderness to palpation at the medial calcaneal tubercale and through the insertion of the plantar fascia on the left and right foot. No pain with compression of calcaneus bilateral. No pain with tuning fork to calcaneus bilateral. No pain with calf compression bilateral.  There is decreased Ankle joint range of motion bilateral. + Pes planus and bunion on right, All other joints range of motion within normal limits bilateral. Strength 5/5 in all groups bilateral.   Assessment and Plan: Problem List Items Addressed This Visit    None    Visit Diagnoses    Nail fungal infection    -  Primary   Tinea       Plantar fasciitis, bilateral       Foot pain, bilateral         -Complete examination performed.  -Re-Discussed with patient in detail the condition of plantar fasciitis, how this  occurs related to the foot type of the patient and general treatment options. -Awaiting PT   -Discussed treatments options for  + Fungal culture of T. Rubrum  -Patient opt for oral Lamisl; Ordered LFTs if normal will send Lamisil to pharmacy -Continue with Clotrimazole -Patient to return to office 6 weeks for med check Landis Martins, DPM

## 2018-07-16 ENCOUNTER — Telehealth: Payer: Self-pay | Admitting: *Deleted

## 2018-07-16 NOTE — Telephone Encounter (Signed)
I told pt I had faxed to [facility] 06/23/2018 and had confirmation the paperwork had been received. Pt and I reviewed the fax number as [phone] as proper and she states she will call them tomorrow to check again and call me or have them call me. I agreed.

## 2018-07-16 NOTE — Telephone Encounter (Signed)
Pt called for the status of her PT referral.

## 2018-08-05 ENCOUNTER — Ambulatory Visit: Payer: Non-veteran care | Admitting: Sports Medicine

## 2018-08-23 ENCOUNTER — Other Ambulatory Visit: Payer: Self-pay | Admitting: Obstetrics and Gynecology

## 2018-11-05 ENCOUNTER — Encounter: Payer: BLUE CROSS/BLUE SHIELD | Admitting: Obstetrics and Gynecology

## 2018-11-12 ENCOUNTER — Encounter: Payer: Self-pay | Admitting: Obstetrics and Gynecology

## 2018-12-23 ENCOUNTER — Encounter: Payer: Self-pay | Admitting: Sports Medicine

## 2018-12-23 ENCOUNTER — Ambulatory Visit (INDEPENDENT_AMBULATORY_CARE_PROVIDER_SITE_OTHER): Payer: No Typology Code available for payment source | Admitting: Sports Medicine

## 2018-12-23 ENCOUNTER — Telehealth: Payer: Self-pay | Admitting: *Deleted

## 2018-12-23 DIAGNOSIS — M722 Plantar fascial fibromatosis: Secondary | ICD-10-CM | POA: Diagnosis not present

## 2018-12-23 DIAGNOSIS — M25571 Pain in right ankle and joints of right foot: Secondary | ICD-10-CM

## 2018-12-23 DIAGNOSIS — B351 Tinea unguium: Secondary | ICD-10-CM

## 2018-12-23 DIAGNOSIS — B359 Dermatophytosis, unspecified: Secondary | ICD-10-CM | POA: Diagnosis not present

## 2018-12-23 DIAGNOSIS — M25572 Pain in left ankle and joints of left foot: Secondary | ICD-10-CM

## 2018-12-23 MED ORDER — CLOTRIMAZOLE 1 % EX SOLN: CUTANEOUS | 5 refills | Status: AC

## 2018-12-23 MED ORDER — CLOTRIMAZOLE 1 % EX SOLN: CUTANEOUS | 5 refills | Status: DC

## 2018-12-23 NOTE — Telephone Encounter (Signed)
-----   Message from Landis Martins, [place] sent at 12/23/2018  8:42 AM EST ----- Regarding: Refill Clotrimazole Send Refill to [facility]

## 2018-12-23 NOTE — Telephone Encounter (Signed)
I asked pt if she would like the Clotrimazole sent to the [facility] and she states yes.

## 2018-12-23 NOTE — Progress Notes (Signed)
Subjective: Marissa Sandoval is a 43 y.o. female returns to office for follow up evaluation of bilateral heel pain that is better and ankleand nail/skin changes. Patient reports that she has had multiple health issues since last visit. Finished PT but continues to have pain. Using braces and canes. No other issues.     Patient Active Problem List   Diagnosis Date Noted  . Post-operative state 09/27/2017  . H/O abdominal hysterectomy 09/18/2017  . Intramural, submucous, and subserous leiomyoma of uterus 09/09/2017  . Pelvic pain in female 09/09/2017  . Menorrhagia 07/15/2015  . Plantar fasciitis 08/14/2013  . Foot pain 08/14/2013  . Forearm pain 07/11/2012  . Gonalgia 07/11/2012  . Lumbar radiculopathy 07/11/2012    Current Outpatient Medications on File Prior to Visit  Medication Sig Dispense Refill  . albuterol (PROVENTIL) (2.5 MG/3ML) 0.083% nebulizer solution Inhale into the lungs.    Marland Kitchen alendronate-cholecalciferol (FOSAMAX PLUS D) 70-2800 MG-UNIT tablet Take by mouth.    . B Complex Vitamins (B COMPLEX PO) Take 1 capsule by mouth daily.    . Cetirizine HCl 10 MG CAPS Take by mouth.    . clotrimazole (LOTRIMIN) 1 % external solution Daily at bedtime In between toes 60 mL 5  . diclofenac sodium (VOLTAREN) 1 % GEL Apply 2 g topically 4 (four) times daily.    Marland Kitchen EPINEPHrine 0.3 mg/0.3 mL IJ SOAJ injection INJECT INTRAMUSCULARLY AS DIRECTED 1 Device 99  . Ferrous Gluconate-C-Folic Acid (IRON-C PO) Take by mouth.    . gabapentin (NEURONTIN) 100 MG capsule Take 1 capsule (100 mg total) by mouth at bedtime. 30 capsule 11  . GLUCOSAMINE HCL PO Take by mouth daily.    Marland Kitchen ibuprofen (ADVIL,MOTRIN) 600 MG tablet   2  . ibuprofen (ADVIL,MOTRIN) 600 MG tablet Take by mouth.    Marland Kitchen ketoconazole (NIZORAL) 2 % shampoo Apply topically.    Marland Kitchen MAGNESIUM PO Take by mouth.    . Menthol-Methyl Salicylate ([place] EX) Apply topically.    . metFORMIN (GLUCOPHAGE) 500 MG tablet TK 1 T PO D  5  .  methylPREDNISolone (MEDROL DOSEPAK) 4 MG TBPK tablet Take as directed 21 tablet 0  . Multiple Vitamins-Minerals (MULTIVITAMIN ADULT PO) Take 1 capsule by mouth daily.    . Naltrexone-buPROPion HCl ER (CONTRAVE) 8-90 MG TB12 Take 2 tablets by mouth daily. Week 1: 1 tab in a.m.  Week 2: 1 tab BID. Week 3: 2 tab in a.m., 1 tab in p.m. Week 4: 2 tablets BID. 120 tablet 3  . PROAIR HFA 108 (90 Base) MCG/ACT inhaler INHALE 2 PUFFS Q 4-6 H PRN  0  . sertraline (ZOLOFT) 25 MG tablet Take 1 tablet (25 mg total) by mouth daily. 30 tablet 11  . sertraline (ZOLOFT) 50 MG tablet TK 1 AND 1/2 TS PO D  1  . Spacer/Aero-Holding Chambers (AEROCHAMBER MV) inhaler by Does not apply route.    Marland Kitchen spironolactone (ALDACTONE) 100 MG tablet Take 1 tablet (100 mg total) by mouth daily. 30 tablet 11  . SYMBICORT 160-4.5 MCG/ACT inhaler INHALE 2 PUFFS PO BID  11  . tretinoin (RETIN-A) 0.05 % cream Apply topically at bedtime.    . triamcinolone cream (KENALOG) 0.1 %   0  . vitamin B-12 (CYANOCOBALAMIN) 1000 MCG tablet Take by mouth.     No current facility-administered medications on file prior to visit.     Allergies  Allergen Reactions  . Latex Itching and Rash  . Penicillins Nausea And Vomiting  Objective:   General:  Alert and oriented x 3, in no acute distress  Dermatology: Skin is warm, dry and supple bilateral lower extremities. Nails 1-10 are normal excess thickness R 4-5 and L 5 toenails and scaly skin in between toes that is improving. There is no erythema, edema, no eccymosis, no open lesions present. Integument is otherwise unremarkable.  Vascular: Dorsalis Pedis pulse and Posterior Tibial pulse are 2/4 bilateral. Capillary fill time is immediate to all digits.  Neurological: Grossly intact to light touch with an achilles reflex of +2/5 and a  negative Tinel's sign bilateral.  Musculoskeletal: No tenderness to palpation at the medial calcaneal tubercale and through the insertion of the plantar  fascia on the left and right foot. + pain bilateral at sinus tarsi. No pain with compression of calcaneus bilateral. No pain with tuning fork to calcaneus bilateral. No pain with calf compression bilateral. There is decreased Ankle joint range of motion bilateral. + Pes planus and bunion on right, All other joints range of motion within normal limits bilateral. Strength 5/5 in all groups bilateral.   Assessment and Plan: Problem List Items Addressed This Visit    None    Visit Diagnoses    Plantar fasciitis, bilateral    -  Primary   Nail fungal infection       Tinea       Bilateral ankle pain, unspecified chronicity         -Complete examination performed.  -Re-Discussed with patient in detail the condition of plantar fasciitis, ankle pain, and tinea, how this  occurs related to the foot type of the patient and general treatment options. -Continue home PT  -Dispensed heel lifts -Recommend OTC suppliments and tonic water for cramps  -Will hold off on PO lamisil at this time due to taking multiple meds for other health issues -Continue with Clotrimazole -Patient to return to office as needed  Landis Martins, DPM

## 2019-04-07 ENCOUNTER — Other Ambulatory Visit: Payer: Self-pay

## 2019-04-07 ENCOUNTER — Encounter: Payer: Self-pay | Admitting: Sports Medicine

## 2019-04-07 ENCOUNTER — Ambulatory Visit (INDEPENDENT_AMBULATORY_CARE_PROVIDER_SITE_OTHER): Payer: No Typology Code available for payment source | Admitting: Sports Medicine

## 2019-04-07 VITALS — Temp 95.2°F

## 2019-04-07 DIAGNOSIS — M25571 Pain in right ankle and joints of right foot: Secondary | ICD-10-CM

## 2019-04-07 DIAGNOSIS — M79671 Pain in right foot: Secondary | ICD-10-CM

## 2019-04-07 DIAGNOSIS — M792 Neuralgia and neuritis, unspecified: Secondary | ICD-10-CM

## 2019-04-07 DIAGNOSIS — Z8739 Personal history of other diseases of the musculoskeletal system and connective tissue: Secondary | ICD-10-CM

## 2019-04-07 MED ORDER — TRIAMCINOLONE ACETONIDE 10 MG/ML IJ SUSP
10.0000 mg | Freq: Once | INTRAMUSCULAR | Status: AC
  Administered 2019-04-07: 10 mg

## 2019-04-07 NOTE — Progress Notes (Signed)
Subjective: Marissa Sandoval is a 43 y.o. female patient who presents to office for evaluation of Right foot pain. Patient complains of progressive pain especially over the last several months like before. Reports that she is seeing a holistic doctor who started her on vit D and Magnesium without improvement in cramping. Reports that she had acipuncture and during the treatment had a sharp pain along the lateral side of her foot that went up her leg on the right. Patient reports that she also has cramping and pain in the arch off and on that is severe. Reports the [facility] has given her a walker as well. Patient denies any other pedal complaints. Denies new injury/trip/fall/sprain/any causative factors but like before admits to an old history of sprain on right.   Patient Active Problem List   Diagnosis Date Noted  . Post-operative state 09/27/2017  . H/O abdominal hysterectomy 09/18/2017  . Intramural, submucous, and subserous leiomyoma of uterus 09/09/2017  . Pelvic pain in female 09/09/2017  . Menorrhagia 07/15/2015  . Plantar fasciitis 08/14/2013  . Foot pain 08/14/2013  . Forearm pain 07/11/2012  . Gonalgia 07/11/2012  . Lumbar radiculopathy 07/11/2012    Current Outpatient Medications on File Prior to Visit  Medication Sig Dispense Refill  . albuterol (PROVENTIL) (2.5 MG/3ML) 0.083% nebulizer solution Inhale into the lungs.    Marland Kitchen alendronate-cholecalciferol (FOSAMAX PLUS D) 70-2800 MG-UNIT tablet Take by mouth.    . B Complex Vitamins (B COMPLEX PO) Take 1 capsule by mouth daily.    . Cetirizine HCl 10 MG CAPS Take by mouth.    . clotrimazole (LOTRIMIN) 1 % external solution Daily at bedtime In between toes 60 mL 5  . diclofenac sodium (VOLTAREN) 1 % GEL Apply 2 g topically 4 (four) times daily.    Marland Kitchen EPINEPHrine 0.3 mg/0.3 mL IJ SOAJ injection INJECT INTRAMUSCULARLY AS DIRECTED 1 Device 99  . Ferrous Gluconate-C-Folic Acid (IRON-C PO) Take by mouth.    . gabapentin (NEURONTIN) 100 MG capsule  Take 1 capsule (100 mg total) by mouth at bedtime. 30 capsule 11  . GLUCOSAMINE HCL PO Take by mouth daily.    Marland Kitchen ibuprofen (ADVIL,MOTRIN) 600 MG tablet   2  . ibuprofen (ADVIL,MOTRIN) 600 MG tablet Take by mouth.    Marland Kitchen ketoconazole (NIZORAL) 2 % shampoo Apply topically.    Marland Kitchen MAGNESIUM PO Take by mouth.    . Menthol-Methyl Salicylate ([place] EX) Apply topically.    . metFORMIN (GLUCOPHAGE) 500 MG tablet TK 1 T PO D  5  . methylPREDNISolone (MEDROL DOSEPAK) 4 MG TBPK tablet Take as directed 21 tablet 0  . Multiple Vitamins-Minerals (MULTIVITAMIN ADULT PO) Take 1 capsule by mouth daily.    . Naltrexone-buPROPion HCl ER (CONTRAVE) 8-90 MG TB12 Take 2 tablets by mouth daily. Week 1: 1 tab in a.m.  Week 2: 1 tab BID. Week 3: 2 tab in a.m., 1 tab in p.m. Week 4: 2 tablets BID. 120 tablet 3  . PROAIR HFA 108 (90 Base) MCG/ACT inhaler INHALE 2 PUFFS Q 4-6 H PRN  0  . sertraline (ZOLOFT) 25 MG tablet Take 1 tablet (25 mg total) by mouth daily. 30 tablet 11  . sertraline (ZOLOFT) 50 MG tablet TK 1 AND 1/2 TS PO D  1  . Spacer/Aero-Holding Chambers (AEROCHAMBER MV) inhaler by Does not apply route.    Marland Kitchen spironolactone (ALDACTONE) 100 MG tablet Take 1 tablet (100 mg total) by mouth daily. 30 tablet 11  . SYMBICORT 160-4.5 MCG/ACT inhaler  INHALE 2 PUFFS PO BID  11  . tretinoin (RETIN-A) 0.05 % cream Apply topically at bedtime.    . triamcinolone cream (KENALOG) 0.1 %   0  . vitamin B-12 (CYANOCOBALAMIN) 1000 MCG tablet Take by mouth.     No current facility-administered medications on file prior to visit.     Allergies  Allergen Reactions  . Latex Itching and Rash  . Penicillins Nausea And Vomiting    Objective:  General: Alert and oriented x3 in no acute distress  Dermatology: No open lesions bilateral lower extremities, no webspace macerations, no ecchymosis bilateral, all nails x 10 are well manicured and are improving with clotrimazole.  Vascular: Dorsalis Pedis and Posterior Tibial pedal  pulses palpable, Capillary Fill Time 3 seconds,(+) pedal hair growth bilateral, no edema bilateral lower extremities however patient reports that she has had some swelling on right foot and ankle, Temperature gradient within normal limits.  Neurology: [name] sensation intact via light touch bilateral,subjective burning and tingling, and numbness on right>left  Musculoskeletal: Mild tenderness with palpation at lateral foot and ankle worse at sinus tarsi on right, + pes planus and bunion on right. Strength within normal limits in all groups bilateral. Knee instability using cane.   Gait: Antalgic gait, Cane assisted.   Assessment and Plan: Problem List Items Addressed This Visit    None    Visit Diagnoses    Sinus tarsi syndrome of right foot    -  Primary   Relevant Medications   triamcinolone acetonide (KENALOG) 10 MG/ML injection 10 mg (Completed) (Start on 04/07/2019  8:30 PM)   Arch pain of right foot       Neuritis       Personal history of fibromyalgia           -Complete examination performed -Previous Xrays reviewed -Discussed treatement options for sinus tarsi syndrome and neuritis  -After oral consent and aseptic prep, injected a mixture containing 1 ml of 2%  plain lidocaine, 1 ml 0.5% plain marcaine, 0.5 ml of kenalog 10 and 0.5 ml of dexamethasone phosphate into right sinus tarsi without complication. Post-injection care discussed with patient.  -Rx NCV/EMG for neuritis -Recommend good supportive shoes daily -Patient to return to office after nerve studies or sooner if condition worsens.  Landis Martins, DPM

## 2019-04-08 ENCOUNTER — Telehealth: Payer: Self-pay | Admitting: *Deleted

## 2019-04-08 DIAGNOSIS — M25571 Pain in right ankle and joints of right foot: Secondary | ICD-10-CM

## 2019-04-08 DIAGNOSIS — Z8739 Personal history of other diseases of the musculoskeletal system and connective tissue: Secondary | ICD-10-CM

## 2019-04-08 DIAGNOSIS — M792 Neuralgia and neuritis, unspecified: Secondary | ICD-10-CM

## 2019-04-08 NOTE — Telephone Encounter (Signed)
-----   Message from Landis Martins, [place] sent at 04/07/2019  3:29 PM EDT ----- Regarding: NCV/Emg NCV bilateral. Patient wants to see if theres a location close to [place] Dx Neuritis Hx of fibromygalia

## 2019-04-08 NOTE — Telephone Encounter (Signed)
Dr. Phillips Odor - Neurologist of [place] line was busy 3 attempts.

## 2019-04-08 NOTE — Telephone Encounter (Signed)
Faxed required [place] Provider - Request For Service form, orders for NCV with EMG, clinicals and demographics to Department of [employer].

## 2019-04-08 NOTE — Telephone Encounter (Signed)
I informed pt I had made 3 attempts to contact Dr. Merlene Laughter in [place] to schedule and the line was busy. I asked pt if she would like me to continue to call Dr. Merlene Laughter or would she like to schedule with our preferred neurology group. Pt states she would like to go ahead and get scheduled with our preferred group. I informed pt we like [facility] located at [address] [place]. Pt states that would be fine.

## 2019-04-13 NOTE — Telephone Encounter (Signed)
Pt called for the status of the nerve conduction study.

## 2019-04-13 NOTE — Telephone Encounter (Signed)
I informed pt the [employer] insurance required a 2ndary referral request be submitted and that had been faxed 04/08/2019, and she may want to check with [employer].

## 2019-05-05 ENCOUNTER — Encounter: Payer: Self-pay | Admitting: Sports Medicine

## 2019-07-10 ENCOUNTER — Telehealth: Payer: Self-pay | Admitting: Sports Medicine

## 2019-07-10 NOTE — Telephone Encounter (Signed)
Left voicemail because fax number left in voicemail is not one of our fax numbers. Asked her to call if she needed our records and provided fax numbers of [phone] and [phone].

## 2019-07-10 NOTE — Telephone Encounter (Signed)
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 Following up on a medical records request we sent on 20 May. We sent it to fax number [phone]. Please call me back at [phone].

## 2019-08-27 ENCOUNTER — Telehealth: Payer: Self-pay | Admitting: Sports Medicine

## 2019-08-27 NOTE — Telephone Encounter (Signed)
Calling to follow up on medical records request sent on 10 July 2019. Please call me back at [phone].

## 2019-08-28 NOTE — Telephone Encounter (Signed)
Left message for Marissa Sandoval to re-fax the request to me at either [phone] or at  [phone]. Told her to call me with any questions at [phone].

## 2019-11-10 ENCOUNTER — Ambulatory Visit: Payer: No Typology Code available for payment source | Admitting: Sports Medicine

## 2019-11-24 ENCOUNTER — Ambulatory Visit (INDEPENDENT_AMBULATORY_CARE_PROVIDER_SITE_OTHER): Payer: No Typology Code available for payment source

## 2019-11-24 ENCOUNTER — Other Ambulatory Visit: Payer: Self-pay

## 2019-11-24 ENCOUNTER — Encounter: Payer: Self-pay | Admitting: Sports Medicine

## 2019-11-24 ENCOUNTER — Ambulatory Visit (INDEPENDENT_AMBULATORY_CARE_PROVIDER_SITE_OTHER): Payer: No Typology Code available for payment source | Admitting: Sports Medicine

## 2019-11-24 ENCOUNTER — Other Ambulatory Visit: Payer: Self-pay | Admitting: Sports Medicine

## 2019-11-24 ENCOUNTER — Encounter

## 2019-11-24 DIAGNOSIS — M79671 Pain in right foot: Secondary | ICD-10-CM | POA: Diagnosis not present

## 2019-11-24 DIAGNOSIS — M79672 Pain in left foot: Secondary | ICD-10-CM

## 2019-11-24 DIAGNOSIS — M25571 Pain in right ankle and joints of right foot: Secondary | ICD-10-CM | POA: Diagnosis not present

## 2019-11-24 DIAGNOSIS — M722 Plantar fascial fibromatosis: Secondary | ICD-10-CM

## 2019-11-24 DIAGNOSIS — M25572 Pain in left ankle and joints of left foot: Secondary | ICD-10-CM

## 2019-11-24 DIAGNOSIS — M779 Enthesopathy, unspecified: Secondary | ICD-10-CM

## 2019-11-24 DIAGNOSIS — R600 Localized edema: Secondary | ICD-10-CM

## 2019-11-24 MED ORDER — FUROSEMIDE 20 MG PO TABS: 20.0000 mg | ORAL_TABLET | Freq: Every day | ORAL | 3 refills | Status: DC

## 2019-11-24 MED ORDER — PREDNISONE 10 MG (21) PO TBPK: ORAL_TABLET | ORAL | 0 refills | Status: DC

## 2019-11-24 MED ORDER — PREDNISONE 10 MG (21) PO TBPK: ORAL_TABLET | ORAL | 0 refills | Status: AC

## 2019-11-24 NOTE — Progress Notes (Addendum)
Subjective: Marissa Sandoval is a 43 y.o. female patient who presents to office for evaluation of bilateral foot and ankle pain reports that she has been having issues with swelling and states that she has pain at her ankles that radiates up into her leg the right one hurts most and reports that her pain even in the heels have worsen especially since September when she started to notice more swelling in her legs and was going to the chiropractor for adjustments but the [place] sent her to and had issues with increasing pain reports that the chiropractor even sent her to the emergency room where she was treated and had lab work and the only thing they found was that her thyroid function was below average and they put her on Synthroid and advised her to follow-up but did nothing for the swelling in her legs.  Patient reports that she also went for her nerve study and she was told that she did not have swelling by the person who was conducting the nerve test and they also called and told her that the nerve study showed changes consistent with fibromyalgia patient is questioning if all of her pain is related to fibromyalgia or if there is more going on.  Patient denies any new trauma or injury since September or any other pedal complaints at this time.  Patient Active Problem List   Diagnosis Date Noted  . Post-operative state 09/27/2017  . H/O abdominal hysterectomy 09/18/2017  . Intramural, submucous, and subserous leiomyoma of uterus 09/09/2017  . Pelvic pain in female 09/09/2017  . Menorrhagia 07/15/2015  . Plantar fasciitis 08/14/2013  . Foot pain 08/14/2013  . Forearm pain 07/11/2012  . Gonalgia 07/11/2012  . Lumbar radiculopathy 07/11/2012    Current Outpatient Medications on File Prior to Visit  Medication Sig Dispense Refill  . alendronate-cholecalciferol (FOSAMAX PLUS D) 70-2800 MG-UNIT tablet Take by mouth.    . B Complex Vitamins (B COMPLEX PO) Take 1 capsule by mouth daily.    . Cetirizine HCl  10 MG CAPS Take by mouth.    . clotrimazole (LOTRIMIN) 1 % external solution Daily at bedtime In between toes 60 mL 5  . diclofenac sodium (VOLTAREN) 1 % GEL Apply 2 g topically 4 (four) times daily.    . DULoxetine (CYMBALTA) 60 MG capsule Take 60 mg by mouth 2 (two) times daily.    Marland Kitchen EPINEPHrine 0.3 mg/0.3 mL IJ SOAJ injection INJECT INTRAMUSCULARLY AS DIRECTED 1 Device 99  . Ferrous Gluconate-C-Folic Acid (IRON-C PO) Take by mouth.    . gabapentin (NEURONTIN) 100 MG capsule Take 1 capsule (100 mg total) by mouth at bedtime. 30 capsule 11  . GLUCOSAMINE HCL PO Take by mouth daily.    Marland Kitchen ibuprofen (ADVIL,MOTRIN) 600 MG tablet   2  . ibuprofen (ADVIL,MOTRIN) 600 MG tablet Take by mouth.    Marland Kitchen ketoconazole (NIZORAL) 2 % shampoo Apply topically.    Marland Kitchen MAGNESIUM PO Take by mouth.    . Menthol-Methyl Salicylate ([place] EX) Apply topically.    . metFORMIN (GLUCOPHAGE) 500 MG tablet TK 1 T PO D  5  . methylPREDNISolone (MEDROL DOSEPAK) 4 MG TBPK tablet Take as directed 21 tablet 0  . Multiple Vitamins-Minerals (MULTIVITAMIN ADULT PO) Take 1 capsule by mouth daily.    . Naltrexone-buPROPion HCl ER (CONTRAVE) 8-90 MG TB12 Take 2 tablets by mouth daily. Week 1: 1 tab in a.m.  Week 2: 1 tab BID. Week 3: 2 tab in a.m., 1 tab in  p.m. Week 4: 2 tablets BID. 120 tablet 3  . PROAIR HFA 108 (90 Base) MCG/ACT inhaler INHALE 2 PUFFS Q 4-6 H PRN  0  . Spacer/Aero-Holding Chambers (AEROCHAMBER MV) inhaler by Does not apply route.    Marland Kitchen spironolactone (ALDACTONE) 100 MG tablet Take 1 tablet (100 mg total) by mouth daily. 30 tablet 11  . SYMBICORT 160-4.5 MCG/ACT inhaler INHALE 2 PUFFS PO BID  11  . tretinoin (RETIN-A) 0.05 % cream Apply topically at bedtime.    . triamcinolone cream (KENALOG) 0.1 %   0  . vitamin B-12 (CYANOCOBALAMIN) 1000 MCG tablet Take by mouth.    Marland Kitchen albuterol (PROVENTIL) (2.5 MG/3ML) 0.083% nebulizer solution Inhale into the lungs.     No current facility-administered medications on file  prior to visit.    Allergies  Allergen Reactions  . Latex Itching and Rash  . Penicillins Nausea And Vomiting    Objective:  General: Alert and oriented x3 in no acute distress  Dermatology: No open lesions bilateral lower extremities, no webspace macerations, no ecchymosis bilateral, all nails x 10 are well manicured Mild discoloration noted brawny pigmentation to the right greater than left dorsal foot possibly consistent with venous stasis.  Vascular: Dorsalis Pedis and Posterior Tibial pedal pulses palpable, Capillary Fill Time 3 seconds,(+) pedal hair growth bilateral, trace edema noted bilateral.  Temperature gradient within normal limits.  Neurology: [name] sensation intact via light touch bilateral,subjective burning and tingling, and numbness on right>left unchanged from prior  Musculoskeletal: Mild to moderate tenderness with palpation at bilateral ankles especially over the extensor tendons onto the leg, minimal reproducible pain at the plantar fascial insertion bilateral, + pes planus and bunion on right. Strength within normal limits in all groups bilateral. Knee instability using cane.   Gait: Antalgic gait, Cane assisted.   X-rays no acute osseous finding there is minimal calcaneal heel spur otherwise all other joint spaces appear normal there is mild soft tissue swelling.  No other acute findings.  Assessment and Plan: Problem List Items Addressed This Visit      Other   Foot pain   Relevant Orders   DG Foot 2 Views Left (Completed)    Other Visit Diagnoses    Acute bilateral ankle pain    -  Primary   Relevant Orders   DG Ankle 2 Views Left (Completed)   Plantar fasciitis, bilateral       Tendonitis       Edema of both lower extremities           -Complete examination performed -X-rays reviewed -Discussed with patient treatment options for bilateral foot pain as well as swelling -Patient verbally told me that her previous nerve studies do show that she  had nerve damage related to fibromyalgia -Patient reports frustration especially with the [place] doctors feels like she is having a lot of pain and not much has been done has had her thyroid medication adjusted because that has been low but still after being on Synthroid for several months still no improvement in symptoms reports that she is most concerned about the swelling and pain that is causing difficulty with walking -Inflammatory panel or other blood work not repeated at this time since patient verbally told me she had previous blood work which did not show any major abnormalities except her low thyroid -Prescribed Lasix for patient to take and advised patient if this is helping with the swelling she will have to follow-up with her PCP for them to continue  this medication -Dispensed surgitube compression sleeve and advised patient of these works well to get over-the-counter compression sleeves until she can get prescription filled at the [place] -Prescribed steroid Dosepak to take for pain and inflammation along ankles and extensor tendons -Advised patient rest elevation good supportive shoes topical pain creams and rubs as needed -Patient told to return to office after 1 month for follow-up evaluation of bilateral foot pain  Landis Martins, DPM

## 2019-11-25 ENCOUNTER — Telehealth: Payer: Self-pay | Admitting: *Deleted

## 2019-11-25 NOTE — Telephone Encounter (Signed)
[facility], [place] called for clarification of rx.

## 2019-11-25 NOTE — Telephone Encounter (Signed)
I spoke with [name] and she stated they do not carry the dose packs and asked if it was the 6 day taking from 6 pills a day to 1. I told Lib it was the 10mg  Prednisone with a tapering dose over 6 days.

## 2019-11-25 NOTE — Telephone Encounter (Signed)
Received e-scribe error of Prednisone TBPK, and Furesomide 11/24/2019. Faxed hand printed prescription with both medications ordered to [facility].

## 2019-12-22 ENCOUNTER — Ambulatory Visit: Payer: No Typology Code available for payment source | Admitting: Sports Medicine

## 2019-12-22 ENCOUNTER — Telehealth (INDEPENDENT_AMBULATORY_CARE_PROVIDER_SITE_OTHER): Payer: No Typology Code available for payment source | Admitting: Sports Medicine

## 2019-12-22 ENCOUNTER — Other Ambulatory Visit: Payer: Self-pay

## 2019-12-22 DIAGNOSIS — R600 Localized edema: Secondary | ICD-10-CM

## 2019-12-22 DIAGNOSIS — M79672 Pain in left foot: Secondary | ICD-10-CM

## 2019-12-22 DIAGNOSIS — M79671 Pain in right foot: Secondary | ICD-10-CM

## 2019-12-22 DIAGNOSIS — M25571 Pain in right ankle and joints of right foot: Secondary | ICD-10-CM

## 2019-12-22 DIAGNOSIS — Z8739 Personal history of other diseases of the musculoskeletal system and connective tissue: Secondary | ICD-10-CM

## 2019-12-22 DIAGNOSIS — M25572 Pain in left ankle and joints of left foot: Secondary | ICD-10-CM

## 2019-12-22 MED ORDER — FUROSEMIDE 20 MG PO TABS: 20.0000 mg | ORAL_TABLET | Freq: Two times a day (BID) | ORAL | 3 refills | Status: AC

## 2019-12-22 NOTE — Progress Notes (Signed)
Virtual Visit via Telephone Note  I connected with Lorey M Griego on 12/22/19 at  5:00 PM EST by telephone/facetime and verified that I am speaking with the correct person using two identifiers.  Location: Patient: Marissa Sandoval   Provider: Landis Martins, DPM   I discussed the limitations, risks, security and privacy concerns of performing an evaluation and management service by telephone and the availability of in person appointments. I also discussed with the patient that there may be a patient responsible charge related to this service. The patient expressed understanding and agreed to proceed.   History of Present Illness: Continued pain and swelling in both feet with difficulty with walking    Observations/Objective: Visually on facetime I was able to see edema to both legs, patient had fabric sleeves as supplied by my office on legs but still had considerable swelling at tops of both feet   Assessment and Plan: Chronic swelling Chronic foot and ankle pain History of Fibromalagia  Recommend gentle range of motion exercises as tolerated Rx Compression Garments via [employer] since the [place] has not dispensed compression garments Advised patient to increase lasix to 20mg  bid to see if this will help Advised patient to discuss with Rheumatologist or Physicatric doctor about medication adjustments for fibromalgia   Follow Up Instructions: 4-6 week in person or vitural    I discussed the assessment and treatment plan with the patient. The patient was provided an opportunity to ask questions and all were answered. The patient agreed with the plan and demonstrated an understanding of the instructions.   The patient was advised to call back or seek an in-person evaluation if the symptoms worsen or if the condition fails to improve as anticipated.  I provided 20 minutes of non-face-to-face time during this encounter.   Landis Martins, DPM

## 2019-12-23 ENCOUNTER — Telehealth: Payer: Self-pay | Admitting: *Deleted

## 2019-12-23 NOTE — Telephone Encounter (Signed)
Dr. Cannon Kettle ordered knee-high 15-14mmHg compression socks. Required order form, demographics faxed to [facility].

## 2020-01-19 ENCOUNTER — Other Ambulatory Visit: Payer: Self-pay

## 2020-01-19 ENCOUNTER — Encounter: Payer: Self-pay | Admitting: Sports Medicine

## 2020-01-19 ENCOUNTER — Ambulatory Visit (INDEPENDENT_AMBULATORY_CARE_PROVIDER_SITE_OTHER): Payer: No Typology Code available for payment source | Admitting: Sports Medicine

## 2020-01-19 VITALS — Temp 96.7°F

## 2020-01-19 DIAGNOSIS — R600 Localized edema: Secondary | ICD-10-CM | POA: Diagnosis not present

## 2020-01-19 DIAGNOSIS — M79671 Pain in right foot: Secondary | ICD-10-CM | POA: Diagnosis not present

## 2020-01-19 DIAGNOSIS — M779 Enthesopathy, unspecified: Secondary | ICD-10-CM | POA: Diagnosis not present

## 2020-01-19 DIAGNOSIS — M7750 Other enthesopathy of unspecified foot: Secondary | ICD-10-CM

## 2020-01-19 DIAGNOSIS — M79672 Pain in left foot: Secondary | ICD-10-CM

## 2020-01-19 DIAGNOSIS — Z8739 Personal history of other diseases of the musculoskeletal system and connective tissue: Secondary | ICD-10-CM

## 2020-01-19 NOTE — Patient Instructions (Signed)
Recommend multi-vitamin And or Alpha loepic acid 600mg  or Vit B complex for your nerves

## 2020-01-19 NOTE — Progress Notes (Signed)
Subjective: Marissa Sandoval is a 44 y.o. female patient who presents to office for follow up evaluation of bilateral foot and ankle pain and swelling. Patient reports that pain is better and swelling is doing better since she got some compression socks off [employer]. Report prickly sensation to both feet and knee and hip pain. No other pedal complaints at this time.  Patient Active Problem List   Diagnosis Date Noted  . Post-operative state 09/27/2017  . H/O abdominal hysterectomy 09/18/2017  . Intramural, submucous, and subserous leiomyoma of uterus 09/09/2017  . Pelvic pain in female 09/09/2017  . Menorrhagia 07/15/2015  . Plantar fasciitis 08/14/2013  . Foot pain 08/14/2013  . Forearm pain 07/11/2012  . Gonalgia 07/11/2012  . Lumbar radiculopathy 07/11/2012    Current Outpatient Medications on File Prior to Visit  Medication Sig Dispense Refill  . alendronate-cholecalciferol (FOSAMAX PLUS D) 70-2800 MG-UNIT tablet Take by mouth.    . B Complex Vitamins (B COMPLEX PO) Take 1 capsule by mouth daily.    . Cetirizine HCl 10 MG CAPS Take by mouth.    . clotrimazole (LOTRIMIN) 1 % external solution Daily at bedtime In between toes 60 mL 5  . diclofenac sodium (VOLTAREN) 1 % GEL Apply 2 g topically 4 (four) times daily.    . DULoxetine (CYMBALTA) 60 MG capsule Take 60 mg by mouth 2 (two) times daily.    Marland Kitchen EPINEPHrine 0.3 mg/0.3 mL IJ SOAJ injection INJECT INTRAMUSCULARLY AS DIRECTED 1 Device 99  . Ferrous Gluconate-C-Folic Acid (IRON-C PO) Take by mouth.    . furosemide (LASIX) 20 MG tablet Take 1 tablet (20 mg total) by mouth 2 (two) times daily. 30 tablet 3  . gabapentin (NEURONTIN) 100 MG capsule Take 1 capsule (100 mg total) by mouth at bedtime. 30 capsule 11  . GLUCOSAMINE HCL PO Take by mouth daily.    Marland Kitchen ibuprofen (ADVIL,MOTRIN) 600 MG tablet   2  . ibuprofen (ADVIL,MOTRIN) 600 MG tablet Take by mouth.    Marland Kitchen ketoconazole (NIZORAL) 2 % shampoo Apply topically.    Marland Kitchen MAGNESIUM PO Take by  mouth.    . Menthol-Methyl Salicylate ([place] EX) Apply topically.    . metFORMIN (GLUCOPHAGE) 500 MG tablet TK 1 T PO D  5  . methylPREDNISolone (MEDROL DOSEPAK) 4 MG TBPK tablet Take as directed 21 tablet 0  . Multiple Vitamins-Minerals (MULTIVITAMIN ADULT PO) Take 1 capsule by mouth daily.    . Naltrexone-buPROPion HCl ER (CONTRAVE) 8-90 MG TB12 Take 2 tablets by mouth daily. Week 1: 1 tab in a.m.  Week 2: 1 tab BID. Week 3: 2 tab in a.m., 1 tab in p.m. Week 4: 2 tablets BID. 120 tablet 3  . predniSONE (STERAPRED UNI-PAK 21 TAB) 10 MG (21) TBPK tablet Take as directed 21 tablet 0  . PROAIR HFA 108 (90 Base) MCG/ACT inhaler INHALE 2 PUFFS Q 4-6 H PRN  0  . Spacer/Aero-Holding Chambers (AEROCHAMBER MV) inhaler by Does not apply route.    Marland Kitchen spironolactone (ALDACTONE) 100 MG tablet Take 1 tablet (100 mg total) by mouth daily. 30 tablet 11  . SYMBICORT 160-4.5 MCG/ACT inhaler INHALE 2 PUFFS PO BID  11  . tretinoin (RETIN-A) 0.05 % cream Apply topically at bedtime.    . triamcinolone cream (KENALOG) 0.1 %   0  . vitamin B-12 (CYANOCOBALAMIN) 1000 MCG tablet Take by mouth.    Marland Kitchen albuterol (PROVENTIL) (2.5 MG/3ML) 0.083% nebulizer solution Inhale into the lungs.     No current  facility-administered medications on file prior to visit.    Allergies  Allergen Reactions  . Latex Itching and Rash  . Penicillins Nausea And Vomiting    Objective:  General: Alert and oriented x3 in no acute distress  Dermatology: No open lesions bilateral lower extremities, no webspace macerations, no ecchymosis bilateral, all nails x 10 are well manicured Mild discoloration noted brawny pigmentation to the right greater than left dorsal foot possibly consistent with venous stasis like before.  Vascular: Dorsalis Pedis and Posterior Tibial pedal pulses palpable, Capillary Fill Time 3 seconds,(+) pedal hair growth bilateral, trace edema noted bilateral.  Temperature gradient within normal limits.  Neurology:  Gross sensation intact via light touch bilateral,subjective burning and tingling, and numbness on right>left unchanged from prior with prickly sensation.   Musculoskeletal: Mild tenderness with palpation at bilateral ankles especially over the anterior ankles and extensor tendons onto the leg, no reproducible pain at the plantar fascial insertion bilateral, + pes planus and bunion on right. Strength within normal limits in all groups bilateral. Knee instability using cane like before.   Assessment and Plan: Problem List Items Addressed This Visit      Other   Foot pain    Other Visit Diagnoses    Edema of both lower extremities    -  Primary   Tendonitis       Capsulitis of ankle, unspecified laterality       Personal history of fibromyalgia          -Complete examination performed -Continue with compression socks -Continue with lasix -Continue with Gabapentin as rx by rheumatologist  -Recommend multi-vitamin or Alpha loepic acid 600mg  or Vit B complex for any additional nerve pain/prickly sensation -Patient to return to office as needed or sooner if problems or issue arise.  Landis Martins, DPM

## 2022-03-29 IMAGING — CR L-SPINE 4 VWS MIN
1 series · 5 of 5 positions shown · non-contrast
Comparison: None

HISTORY: 45-year-old female with polyarthritis. Per patient, low back pain radiating to right hip, no known injury.
TECHNIQUE: 5 view radiograph of the lumbar spine.

[Series 1: AP · 0.17mm/px · 5 of 5 slices shown]
[im 1/5]
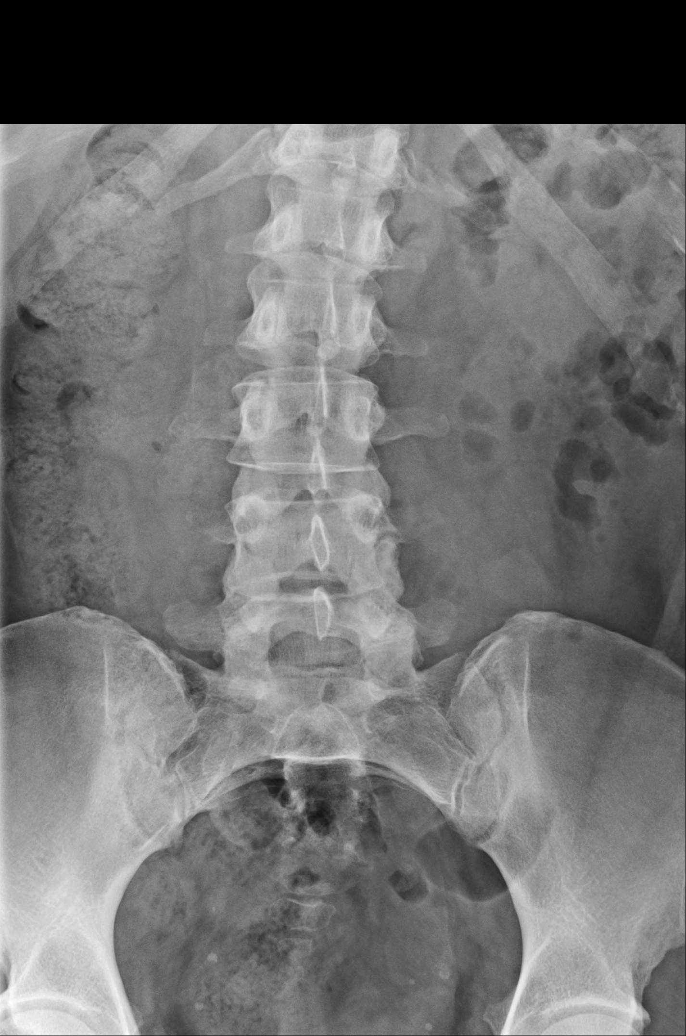
[im 2/5]
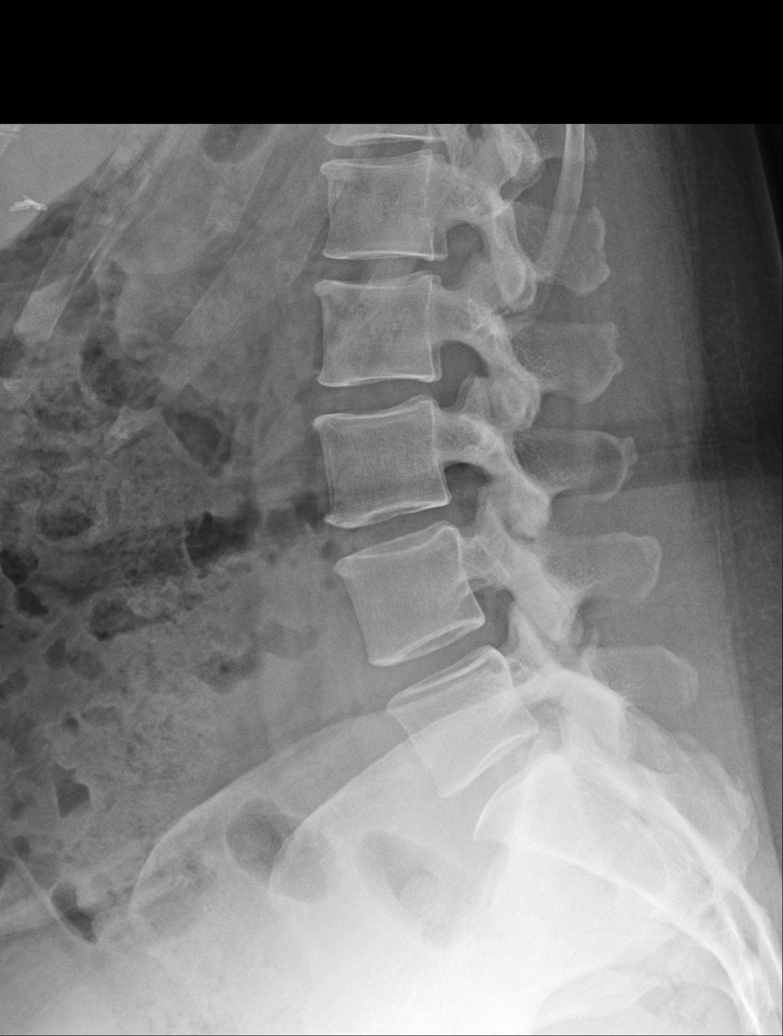
[im 3/5]
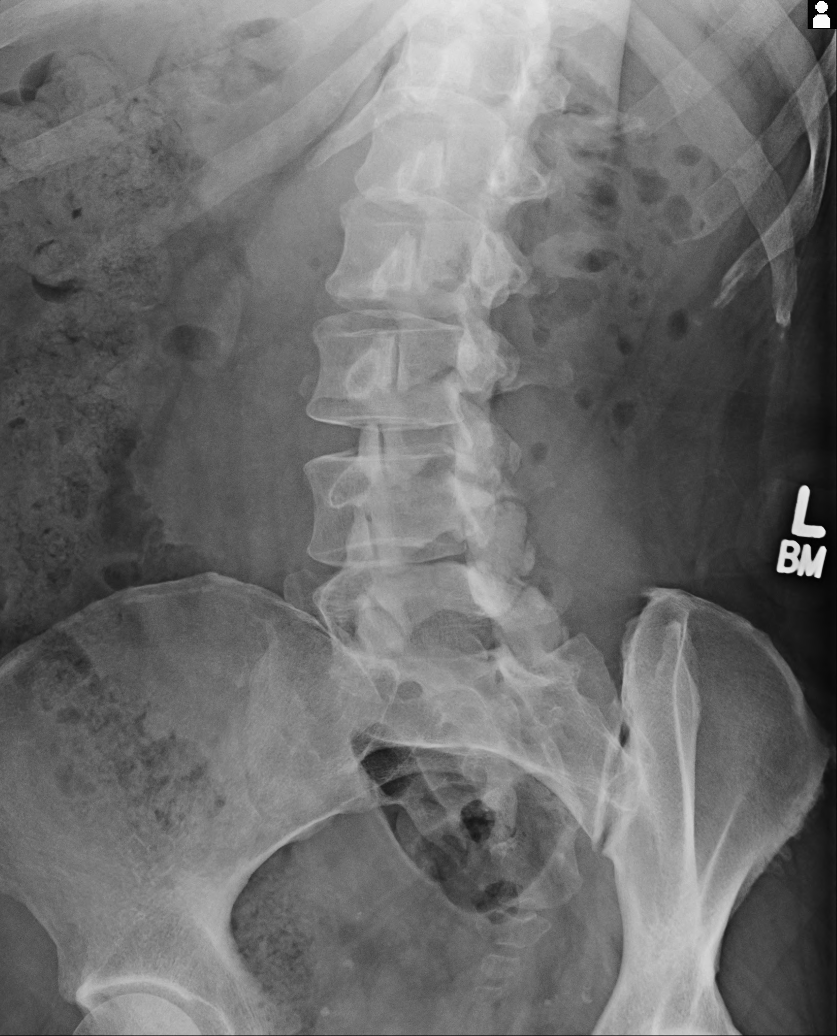
[im 4/5]
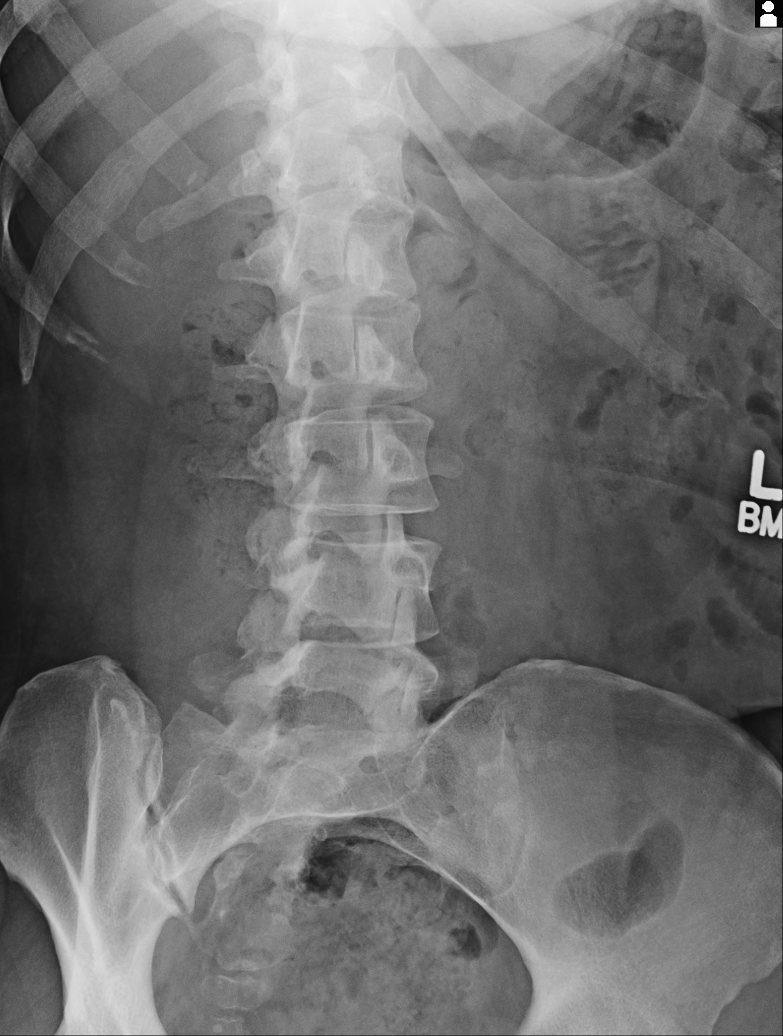
[im 5/5]
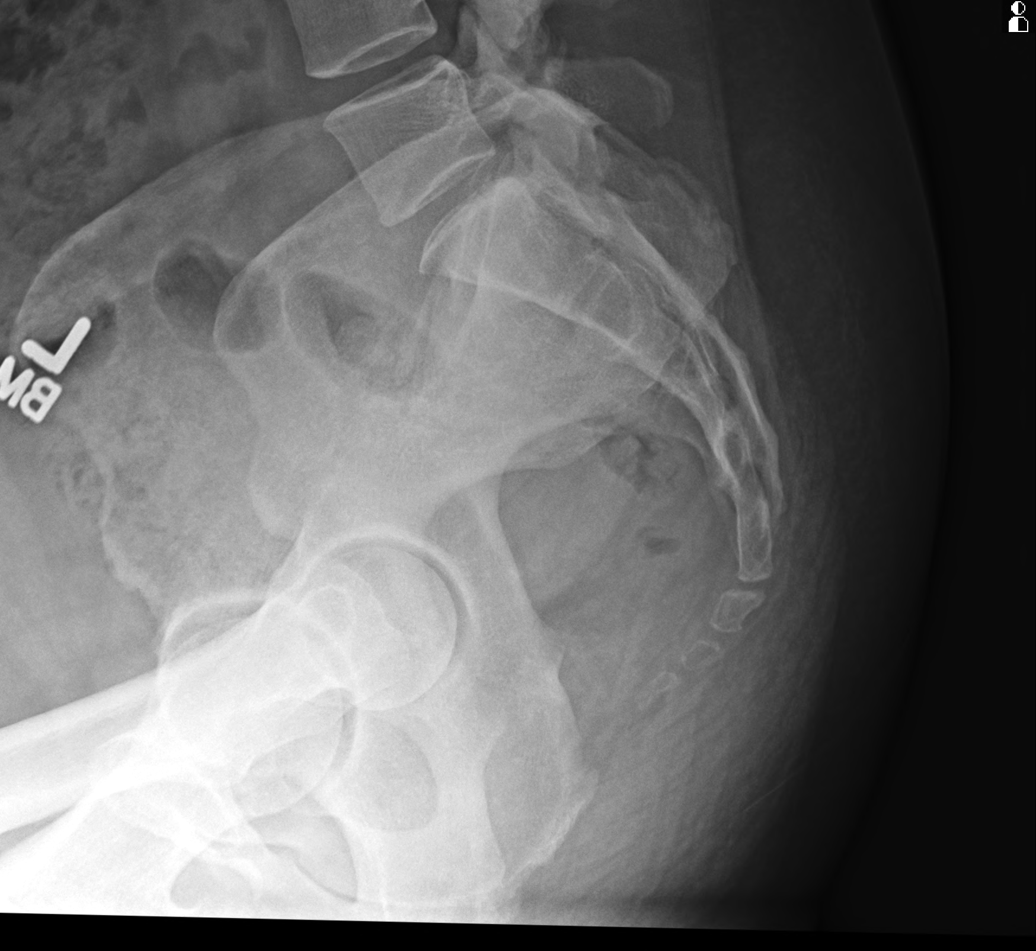

[5 of 5 positions shown; findings below may reference images not displayed]

FINDINGS: Please note that spinal labeling was performed assuming there are 5 non-rib-bearing vertebrae.

Mild rightward curvature of the lumbar spine. No subluxations. Vertebral body heights are maintained. Multilevel degenerative disc disease and osteophytosis.
IMPRESSION: Multilevel spondylotic changes.
